# Patient Record
Sex: Male | Born: 1950 | Race: White | Hispanic: No | Marital: Single | Smoking: Never smoker
Health system: Southern US, Community
[De-identification: ages and names within clinical notes are randomized; demographics above are authoritative.]

## PROBLEM LIST (undated history)

## (undated) DIAGNOSIS — G4733 Obstructive sleep apnea (adult) (pediatric): Secondary | ICD-10-CM

## (undated) DIAGNOSIS — G4730 Sleep apnea, unspecified: Secondary | ICD-10-CM

## (undated) DIAGNOSIS — M549 Dorsalgia, unspecified: Secondary | ICD-10-CM

## (undated) DIAGNOSIS — E669 Obesity, unspecified: Secondary | ICD-10-CM

## (undated) DIAGNOSIS — E119 Type 2 diabetes mellitus without complications: Secondary | ICD-10-CM

## (undated) DIAGNOSIS — K219 Gastro-esophageal reflux disease without esophagitis: Secondary | ICD-10-CM

## (undated) HISTORY — PX: COLONOSCOPY: SHX174

## (undated) HISTORY — DX: Obesity, unspecified: E66.9

## (undated) HISTORY — PX: POLYPECTOMY: SHX149

## (undated) HISTORY — DX: Dorsalgia, unspecified: M54.9

## (undated) HISTORY — DX: Sleep apnea, unspecified: G47.30

## (undated) HISTORY — PX: TOE SURGERY: SHX1073

## (undated) HISTORY — DX: Obstructive sleep apnea (adult) (pediatric): G47.33

## (undated) HISTORY — DX: Type 2 diabetes mellitus without complications: E11.9

## (undated) HISTORY — PX: HERNIA REPAIR: SHX51

## (undated) HISTORY — DX: Gastro-esophageal reflux disease without esophagitis: K21.9

---

## 1988-10-31 HISTORY — PX: HEMANGIOMA EXCISION: SHX1734

## 2004-05-21 ENCOUNTER — Encounter: Admission: RE | Admit: 2004-05-21 | Discharge: 2004-08-19 | Payer: Self-pay | Admitting: Family Medicine

## 2007-02-19 ENCOUNTER — Ambulatory Visit: Payer: Self-pay | Admitting: Internal Medicine

## 2007-03-14 ENCOUNTER — Ambulatory Visit (HOSPITAL_BASED_OUTPATIENT_CLINIC_OR_DEPARTMENT_OTHER): Admission: RE | Admit: 2007-03-14 | Discharge: 2007-03-14 | Payer: Self-pay | Admitting: Internal Medicine

## 2007-03-18 ENCOUNTER — Ambulatory Visit: Payer: Self-pay | Admitting: Internal Medicine

## 2007-04-03 ENCOUNTER — Ambulatory Visit: Payer: Self-pay | Admitting: Internal Medicine

## 2007-04-23 ENCOUNTER — Ambulatory Visit: Payer: Self-pay | Admitting: Internal Medicine

## 2007-04-25 ENCOUNTER — Encounter: Admission: RE | Admit: 2007-04-25 | Discharge: 2007-04-25 | Payer: Self-pay | Admitting: Internal Medicine

## 2007-05-02 ENCOUNTER — Ambulatory Visit: Payer: Self-pay | Admitting: Internal Medicine

## 2007-05-02 LAB — CONVERTED CEMR LAB
ALT: 28 U/L
AST: 35 U/L
Albumin: 4.1 g/dL
Alkaline Phosphatase: 61 U/L
BUN: 12 mg/dL
Basophils Absolute: 0 K/uL
Basophils Relative: 0 %
Bilirubin Urine: NEGATIVE
Bilirubin, Direct: 0.2 mg/dL
CO2: 28 meq/L
Calcium: 9 mg/dL
Chloride: 107 meq/L
Cholesterol: 181 mg/dL
Creatinine, Ser: 0.9 mg/dL
Eosinophils Absolute: 0.1 K/uL
Eosinophils Relative: 1.7 %
GFR calc Af Amer: 113 mL/min
GFR calc non Af Amer: 93 mL/min
Glucose, Bld: 109 mg/dL — ABNORMAL HIGH
HCT: 42.4 %
HDL: 31.5 mg/dL — ABNORMAL LOW
Hemoglobin, Urine: NEGATIVE
Hemoglobin: 14.6 g/dL
Ketones, ur: NEGATIVE mg/dL
LDL Cholesterol: 119 mg/dL — ABNORMAL HIGH
Leukocytes, UA: NEGATIVE
Lymphocytes Relative: 36 %
MCHC: 34.4 g/dL
MCV: 85.5 fL
Monocytes Absolute: 0.7 K/uL
Monocytes Relative: 9.5 %
Neutro Abs: 3.7 K/uL
Neutrophils Relative %: 52.8 %
Nitrite: NEGATIVE
PSA: 0.46 ng/mL
Platelets: 292 K/uL
Potassium: 3.9 meq/L
RBC: 4.95 M/uL
RDW: 12.8 %
Sodium: 139 meq/L
Specific Gravity, Urine: <=1.005
TSH: 1.3 u[IU]/mL
Total Bilirubin: 1.3 mg/dL — ABNORMAL HIGH
Total CHOL/HDL Ratio: 5.7
Total Protein, Urine: NEGATIVE mg/dL
Total Protein: 7.4 g/dL
Triglycerides: 151 mg/dL — ABNORMAL HIGH
Uric Acid, Serum: 7.4 mg/dL — ABNORMAL HIGH
Urine Glucose: NEGATIVE mg/dL
Urobilinogen, UA: 0.2
VLDL: 30 mg/dL
WBC: 7 10*3/microliter
pH: 6

## 2007-05-03 ENCOUNTER — Ambulatory Visit: Payer: Self-pay | Admitting: Internal Medicine

## 2007-06-11 ENCOUNTER — Ambulatory Visit: Payer: Self-pay | Admitting: Internal Medicine

## 2007-07-23 ENCOUNTER — Ambulatory Visit: Payer: Self-pay | Admitting: Gastroenterology

## 2007-08-09 DIAGNOSIS — G4733 Obstructive sleep apnea (adult) (pediatric): Secondary | ICD-10-CM | POA: Insufficient documentation

## 2007-08-09 DIAGNOSIS — E669 Obesity, unspecified: Secondary | ICD-10-CM | POA: Insufficient documentation

## 2007-08-09 DIAGNOSIS — K219 Gastro-esophageal reflux disease without esophagitis: Secondary | ICD-10-CM | POA: Insufficient documentation

## 2007-08-31 ENCOUNTER — Encounter: Payer: Self-pay | Admitting: Gastroenterology

## 2007-08-31 ENCOUNTER — Ambulatory Visit: Payer: Self-pay | Admitting: Gastroenterology

## 2007-08-31 ENCOUNTER — Encounter: Payer: Self-pay | Admitting: Internal Medicine

## 2007-10-09 ENCOUNTER — Encounter: Payer: Self-pay | Admitting: Internal Medicine

## 2007-11-05 ENCOUNTER — Ambulatory Visit: Payer: Self-pay | Admitting: Internal Medicine

## 2007-11-05 DIAGNOSIS — N5089 Other specified disorders of the male genital organs: Secondary | ICD-10-CM | POA: Insufficient documentation

## 2007-11-05 DIAGNOSIS — M25569 Pain in unspecified knee: Secondary | ICD-10-CM | POA: Insufficient documentation

## 2007-11-05 DIAGNOSIS — M546 Pain in thoracic spine: Secondary | ICD-10-CM | POA: Insufficient documentation

## 2007-11-12 ENCOUNTER — Ambulatory Visit: Payer: Self-pay | Admitting: Internal Medicine

## 2007-11-15 ENCOUNTER — Encounter: Payer: Self-pay | Admitting: Internal Medicine

## 2007-11-20 ENCOUNTER — Encounter: Payer: Self-pay | Admitting: Internal Medicine

## 2007-11-22 ENCOUNTER — Telehealth: Payer: Self-pay | Admitting: Internal Medicine

## 2007-11-26 ENCOUNTER — Telehealth (INDEPENDENT_AMBULATORY_CARE_PROVIDER_SITE_OTHER): Payer: Self-pay | Admitting: *Deleted

## 2007-11-27 ENCOUNTER — Encounter: Payer: Self-pay | Admitting: Internal Medicine

## 2008-08-26 ENCOUNTER — Ambulatory Visit: Payer: Self-pay | Admitting: Internal Medicine

## 2008-08-26 DIAGNOSIS — B070 Plantar wart: Secondary | ICD-10-CM | POA: Insufficient documentation

## 2008-08-26 DIAGNOSIS — L259 Unspecified contact dermatitis, unspecified cause: Secondary | ICD-10-CM | POA: Insufficient documentation

## 2008-08-26 DIAGNOSIS — IMO0002 Reserved for concepts with insufficient information to code with codable children: Secondary | ICD-10-CM | POA: Insufficient documentation

## 2008-11-21 ENCOUNTER — Ambulatory Visit: Payer: Self-pay | Admitting: Internal Medicine

## 2008-11-24 ENCOUNTER — Ambulatory Visit: Payer: Self-pay | Admitting: Internal Medicine

## 2008-11-28 ENCOUNTER — Telehealth: Payer: Self-pay | Admitting: Internal Medicine

## 2008-12-15 ENCOUNTER — Ambulatory Visit: Payer: Self-pay | Admitting: Internal Medicine

## 2009-05-08 ENCOUNTER — Ambulatory Visit: Payer: Self-pay | Admitting: Internal Medicine

## 2009-05-08 DIAGNOSIS — R002 Palpitations: Secondary | ICD-10-CM | POA: Insufficient documentation

## 2009-05-09 ENCOUNTER — Telehealth: Payer: Self-pay | Admitting: Internal Medicine

## 2009-08-28 ENCOUNTER — Ambulatory Visit: Payer: Self-pay | Admitting: Family

## 2009-08-28 DIAGNOSIS — R42 Dizziness and giddiness: Secondary | ICD-10-CM | POA: Insufficient documentation

## 2009-08-31 ENCOUNTER — Ambulatory Visit: Payer: Self-pay | Admitting: Internal Medicine

## 2009-08-31 DIAGNOSIS — K0500 Acute gingivitis, plaque induced: Secondary | ICD-10-CM | POA: Insufficient documentation

## 2010-06-15 ENCOUNTER — Ambulatory Visit: Payer: Self-pay | Admitting: Radiology

## 2010-06-15 ENCOUNTER — Ambulatory Visit (HOSPITAL_BASED_OUTPATIENT_CLINIC_OR_DEPARTMENT_OTHER): Admission: RE | Admit: 2010-06-15 | Discharge: 2010-06-15 | Payer: Self-pay | Admitting: Internal Medicine

## 2010-06-15 ENCOUNTER — Telehealth: Payer: Self-pay | Admitting: Internal Medicine

## 2010-06-15 ENCOUNTER — Ambulatory Visit: Payer: Self-pay | Admitting: Internal Medicine

## 2010-06-15 DIAGNOSIS — M25519 Pain in unspecified shoulder: Secondary | ICD-10-CM | POA: Insufficient documentation

## 2010-07-15 ENCOUNTER — Encounter: Payer: Self-pay | Admitting: Internal Medicine

## 2010-11-28 LAB — CONVERTED CEMR LAB
Calcium: 9.3 mg/dL (ref 8.4–10.5)
Creatinine, Ser: 1.05 mg/dL (ref 0.40–1.50)
Free T4: 0.97 ng/dL (ref 0.80–1.80)
Potassium: 4 meq/L (ref 3.5–5.3)
Sodium: 141 meq/L (ref 135–145)

## 2010-12-01 NOTE — Assessment & Plan Note (Signed)
Summary: shoulder pain & needs finger looked at/dt also has plantar wart   Vital Signs:  Patient profile:   60 year old male Weight:      289 pounds BMI:     41.03 O2 Sat:      95 % on Room air Temp:     97.9 degrees F oral Pulse rate:   67 / minute Pulse rhythm:   regular Resp:     20 per minute BP sitting:   124 / 90  (left arm) Cuff size:   large  Vitals Entered By: Glendell Docker CMA (June 15, 2010 9:53 AM)  O2 Flow:  Room air CC: Left shoulder pain Is Patient Diabetic? No Pain Assessment Patient in pain? yes     Location: shoulder Intensity: 2 Type: aching Onset of pain  With activity Comments left shoulder pain work related injury, onset about 2 months ago, intermittent pain with activity, pt has questions about what a normal pulse should be for him   Primary Care Provider:  D. Thomos Lemons DO  CC:  Left shoulder pain.  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  goes back 4-5 yrs lifted heavy branch - oct 2010 (tweeked shoulder) got better soon thereafter last 1-2 months - progressively worse pain with int rotation - can't itch his back aches at night,  worse laying on left side usually dull ache, sometimes feels like shoulder comes out of joint    Preventive Screening-Counseling & Management  Alcohol-Tobacco     Smoking Status: current  Allergies: 1)  ! * Sodium Pentathol  Past History:  Past Medical History: Obesity Obstructive Sleep Apnea Chronic GERD with remote history of duodenal ulcer  Chronic back pain         Past Surgical History: Excision of left leg cavernous hemangioma - 1990        Family History: Mother is age 25 with history of CAD, hypertension and severe DJD of the spine Mother  with history of bone cancer  Brother with history of congenital heart murmur Mother - NPH   Social History: Occupation:  Works in Therapist, sports Single Never Smoked  Alcohol use-yes (two to 3 alcoholic beverages per week) Hobby - scuba diving     Smoking Status:  current  Review of Systems  The patient denies chest pain and dyspnea on exertion.         occ rash on right middle finger,    Physical Exam  General:  alert and overweight-appearing.   Head:  normocephalic and atraumatic.   Neck:  thick neckno carotid bruits.   Lungs:  Normal respiratory effort, chest expands symmetrically. Lungs are clear to auscultation, no crackles or wheezes. Heart:  Normal rate and regular rhythm. S1 and S2 normal without gallop, murmur, click, rub or other extra sounds. Msk:  pain with left shoulder int rotation Skin:  mild scaly patch right middle finger wart on ball of right foot   Impression & Recommendations:  Problem # 1:  SHOULDER PAIN, LEFT, CHRONIC (ICD-719.41) chronic left shoulder pain.  hx of prev work related injury.   question labrum tear.  refer to ortho for further eval.  defer MRI of shoulder to ortho His updated  medication list for this problem includes:    Low-dose Aspirin 81 Mg Tabs (Aspirin) .Marland Kitchen... Take 1 tablet by mouth once a day  Orders: T-Shoulder Left Min 2 Views (73030TC) Orthopedic Referral (Ortho)  Problem # 2:  PLANTAR WART, RIGHT (ICD-078.12) more symptomatic.  refer to podiatry for possible surgical excision Orders: Podiatry Referral (Podiatry)  Problem # 3:  ECZEMA (ICD-692.9) pt with scaly rash on right middle finger.  The following medications were removed from the medication list:    Triamcinolone Acetonide 0.5 % Crea (Triamcinolone acetonide) .Marland Kitchen... Apply bid  Complete Medication List: 1)  Low-dose Aspirin 81 Mg Tabs (Aspirin) .... Take 1 tablet by mouth once a day 2)  Clotrimazole-betamethasone 1-0.05 % Crea (Clotrimazole-betamethasone) ....  Apply two times a day x 2 weeks as directed  Patient Instructions: 1)  Please schedule a follow-up appointment in 6 months Prescriptions: CLOTRIMAZOLE-BETAMETHASONE 1-0.05 % CREA (CLOTRIMAZOLE-BETAMETHASONE) apply two times a day x 2 weeks as directed  #30 grams x 1   Entered and Authorized by:   D. Thomos Lemons DO   Signed by:   D. Thomos Lemons DO on 06/15/2010   Method used:   Electronically to        [id number] (retail)       [address]       [place], [place]  [address]       Ph: [phone] or [phone]       Fax: [phone]   RxID:   [id number]   Current Allergies (reviewed today): ! * SODIUM PENTATHOL

## 2010-12-01 NOTE — Consult Note (Signed)
Summary: [facility]  [facility]   Imported By: Lanelle Bal 07/28/2010 14:20:10  _____________________________________________________________________  External Attachment:    Type:   Image     Comment:   External Document

## 2010-12-01 NOTE — Progress Notes (Signed)
Summary: Xray Results  Phone Note Outgoing Call   Summary of Call: call pt - xray of shoulder is normal Initial call taken by: D. Thomos Lemons DO,  June 15, 2010 2:06 PM  Follow-up for Phone Call        call placed to patient at [phone], he was advised per Dr Artist Pais instructions Follow-up by: Glendell Docker CMA,  June 16, 2010 9:24 AM

## 2011-03-15 NOTE — Assessment & Plan Note (Signed)
[facility]                           PRIMARY CARE OFFICE NOTE   CHERYL, STABENOW                        MRN:          [id number]  DATE:04/23/2007                            DOB:          05-14-1951    CHIEF COMPLAINT:  New patient to practice.   HISTORY OF PRESENT ILLNESS:  The patient is a 60 year old white male  here for continuity of care.  He has seen Dr. Maple Hudson in the past for  symptoms of daytime somnolence and was ultimately diagnosed with  obstructive sleep apnea.  Other than sleep apnea and periodic elevated  blood pressure readings in the past, he does not note any major  illnesses including heart disease or type 2 diabetes.  He does have  chronic GERD and was diagnosed with a duodenal ulcer 25 years ago.  He  consumes a large amount of caffeinated beverages/diet coke,  approximately two 6-packs per day.  Since decreasing his caffeinated  beverage use, he has noticed some improvement in his heartburn symptoms.  He does have occasional dysphagia with steak/bread getting stuck near  his epigastric area.   He has struggled with obesity for most of his life.  He is currently at  his weight of 285 pounds.  He has been in between 250-280 within the  past 5 years.  His main concern today has been bilateral swelling of his  feet; this has been ongoing since June 8.  He was [place] and was in a long plane ride.  He  complains of bilateral leg swelling and also severe pain intermittently  in his great toe of his right and left foot.  There was some associated  redness.   PAST MEDICAL HISTORY SUMMARY:  1. Obstructive sleep apnea.  2. Elevated blood pressure readings without diagnosis of hypertension.  3. Chronic GERD with remote history of duodenal ulcer.  4. History of surgery for left leg cavernous hemangioma in 1990.   CURRENT MEDICATIONS:  None.   ALLERGIES TO MEDICATIONS:  None known.   SOCIAL HISTORY:  The patient is single, currently works in Engineer, structural estate.   FAMILY HISTORY:  Mother is age 89 with a history of coronary disease,  hypertension and severe DJD of the spine.  Father is 42, relatively  healthy, and has a younger brother who was born with a heart murmur.  Mother is known to have bone cancer before which was removed.   HABITS:  Two to three alcoholic beverages per week.  No tobacco.   REVIEW OF SYSTEMS:  No fevers or chills.  Denies any HEENT symptoms.  Has some dyspnea on exertion, but no chest pain.  Heartburn symptoms as  noted above.  No dysuria, frequency or urgency and all other systems  negative.  The patient has had chronic back pain in his thoracolumbar  junction.   PHYSICAL EXAMINATION:  VITALS:  Weight is 285.2 pounds, temperature is  98.9, pulse is 65, BP is 142/86 in the left arm in a seated position.  GENERAL:  The patient is  a pleasant, morbidly obese 60 year old white  male, pleasant, in no apparent distress.  HEENT:  Normocephalic, atraumatic.  Pupils were equal, round and  reactive to light bilaterally.  Extraocular muscle mobility were intact.  The patient was anicteric.  Conjunctivae were within normal limits.  External auditory canals and tympanic membranes were clear bilaterally.  Hearing was grossly normal.  Oropharyngeal exam was unremarkable.  NECK:  Thick.  I could not appreciate carotid bruit.  CHEST:  Normal respiratory effort.  Chest was clear to auscultation  bilaterally.  No rhonchi, rales or wheezing.  CARDIOVASCULAR:  Regular rate and rhythm.  No significant murmurs, rubs  or gallops appreciated.  ABDOMEN:  Protuberant and nontender.  Positive bowel sounds.  No  organomegaly.   IMPRESSION/RECOMMENDATIONS:  1. Lower extremity edema, rule out deep venous thrombosis.  2. Possible gout.  3. Chronic lower back pain.  4. Obstructive sleep apnea.  5. Gastroesophageal reflux disease exacerbated by caffeine  use.  6. Health maintenance.   RECOMMENDATIONS:  1. The patient will be sent for venous Doppler to rule out DVT.  An x-      ray of the lumbar and thoracic spine will be performed.  2.  We      will screen him for type 2 diabetes and check his fasting lipid      profile.  2. The patient is to decrease caffeine use.  He deferred starting      omeprazole 20 mg once a day.  We discussed eventual referral to      Gastroenterology for EGD to rule out esophageal stricture.  3. Follow up in about 2 weeks.     Barbette Hair. Artist Pais, DO  Electronically Signed    RDY/MedQ  DD: 04/23/2007  DT: 04/24/2007  Job #: [id number]

## 2011-03-15 NOTE — Assessment & Plan Note (Signed)
[name]                             PULMONARY OFFICE NOTE   NAME:[name], [name]                        MRN:          [id number]  DATE:04/03/2007                            DOB:          [birth year]    PROBLEM LIST:  1. Obstructive sleep apnea.  2. Exogenous obesity.   HISTORY:  His sleep study of Mar 14, 2007 demonstrated moderately severe  obstructive apnea, with an index of 41 per hour, nonpositional events,  moderate to loud snoring, and oxygen desaturation to 81%.  CPAP was  titrated to 15 CWP, for an index of 0 per hour.  He had been working  with [employer], using their CPAP representative.  I gave him the  opportunity of returning to that home care support for pressure change  or establishing with a different company.  He is going to start by  trying to work with his existing team.  He is trying to lose some weight  by dieting.  We had previously reviewed sleep hygiene and alternative  therapies for sleep apnea.   MEDICATIONS:  Limited to CPAP, currently at 9 CWP, which had been  insufficient.   OBJECTIVE:  VITAL SIGNS:  Weight 286 pounds, BP 122/72, pulse 105, room  air saturation 97%.  GENERAL:  He is obese, alert.  HEENT:  Nasal airway is unobstructed.  There are no pressure marks on  his face from CPAP.  HEART:  Sounds are regular, without murmur.  LUNGS:  Clear.   IMPRESSION:  Moderately severe obstructive sleep apnea.   PLAN:  1. Weight loss.  2. Try CPAP at 15 CWP.  3. He is referred to a nutritionalist at [facility] at his request      to support his weight loss effort.  4. Schedule return in 2 months, earlier p.r.n.     Clinton D. Maple Hudson, MD, Tonny Bollman, FACP  Electronically Signed    CDY/MedQ  DD: 04/04/2007  DT: 04/05/2007  Job #: [id number]

## 2011-03-15 NOTE — Assessment & Plan Note (Signed)
[facility]                             PULMONARY OFFICE NOTE   NAME:JOHNSONClancy, Mullarkey                        MRN:          [id number]  DATE:06/11/2007                            DOB:          1951/08/29    PROBLEM:  1. Obstructive sleep apnea.  2. Exogenous obesity.   PRIMARY CARE PHYSICIAN:  Barbette Hair. 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 He is  experiencing some nasal congestion with this, but not using his  humidifier and he is aware of some mouth venting. We have discussed use  and comfort measures. He is not on other medications.   OBJECTIVE:  Weight 286 pounds, blood pressure 128/78, pulse 68. Room air  saturation is 97%.  He is somewhat overweight. Quite alert.  Nose and throat are clear.  Breathing is unlabored.  Pulse regular.   IMPRESSION:  Obstructive sleep apnea. He is probably experiencing some  vasomotor rhinitis from airflow related to the CPAP and we have  discussed comfort measures.   PLAN:  1. Try reducing CPAP to 14 CWP.  2. He is going to try a chin strap.  3. Continue efforts to lose weight.  4. He is going to start using the humidifier attachment on his      machine.  5. Schedule return four months, earlier p.r.n.     Clinton D. Maple Hudson, MD, Tonny Bollman, FACP  Electronically Signed    CDY/MedQ  DD: 06/11/2007  DT: 06/12/2007  Job #: [id number]   cc:   Barbette Hair. Artist Pais, DO

## 2011-03-15 NOTE — Procedures (Signed)
NAME:  [name], [name]               ACCOUNT NO.:  [id number]   MEDICAL RECORD NO.:  [id number]          PATIENT TYPE:  OUT   LOCATION:  SLEEP CENTER                 FACILITY:  [facility]   PHYSICIAN:  Clinton D. Maple Hudson, MD, FCCP, FACPDATE OF BIRTH:  04/[birth year]/52   DATE OF STUDY:  03/14/2007                            NOCTURNAL POLYSOMNOGRAM   REFERRING PHYSICIAN:   REFERRING PHYSICIAN:  Clinton D. Young, MD, FCCP, FACP   INDICATION FOR STUDY:  Hypersomnia with sleep apnea.  Epworth sleepiness  score 11 to 14/24.  BMI 39.8.  Weight 282 pounds.   SLEEP ARCHITECTURE:  Total sleep time 307 minutes with sleep efficiency  78%.  Stage I was 7%, stage II 78%, stages III and IV were 1%, REM 14%  of total sleep time.  Sleep latency 53 minutes.  REM latency 130  minutes.  Awake after sleep onset 34 minutes, arousal index 11.7.  No  bedtime medication was taken.   RESPIRATORY DATA:  Split-study protocol.  Apnea/hypotonia index (AHI,  RDI) 41obstructive events per hour indicating moderately severe  obstructive sleep apnea/hypotonia syndrome before CPAP.  There were 37  obstructive apneas, one central apnea, two mixed apneas, and 55  hypotonias before CPAP.  Events were not positional.  REMAHI 27.6.  CPAP  was titrated to 15 CWP, AHI 0 per hour.  A medium ResMed Swift 2 nasal  mask was used with heated humidifier.   OXYGEN DATA:  Moderate to loud snoring with oxygen desaturation to a  nadir of 81% before CPAP.  After CPAP control, saturation held 94% on  room air.  Snoring was prevented and oxygen saturation held at 95% on  CPAP.   CARDIAC DATA:  Normal sinus rhythm.   MOVEMENT/PARASOMNIA:  Occasional limb jerk, insignificant.   IMPRESSION/RECOMMENDATIONS:  1. Moderately severe obstructive sleep apnea/hypotonia syndrome, AHI      41 per hour with nonpositional events, moderate to loud snoring and      oxygen desaturation to a nadir of 81%.  2. Successful CPAP titration to 15 CWP, AHI 0 per hour.   May need a      ResMed Swift 2 nasal mask was used with heated humidifier.      Clinton D. Maple Hudson, MD, [facility], FACP  Diplomate, Biomedical engineer of Sleep Medicine  Electronically Signed     CDY/MEDQ  D:  03/18/2007 11:40:23  T:  03/19/2007 02:14:21  Job:  [id number]

## 2011-03-18 NOTE — Assessment & Plan Note (Signed)
[facility]                             PULMONARY OFFICE NOTE   NAME:JOHNSONCurlie, Macken                        MRN:          [id number]  DATE:02/19/2007                            DOB:          [birth year]    PROBLEM:  This is a 60 year old man with obstructive sleep apnea, coming  to establish for care upon his own referral.  He had been followed by  [facility], but is no longer followed by them and asks to be  established with a new primary physician.  He had had a sleep study  through [facility] with an index of 22 obstructive events per hour.  He has been using CPAP at 9-10 CWP with a nasal pillow's mask.  Initially, that worked well.  He has gained weight.  He says now that he  remains tired.  He also wakes with his hands and feet asleep when he has  been using CPAP, which may be related to some restriction of movement.  He finds he is drawing air in through the mask vents.  He took his  machine and computer chip to [facility] and the technician told him  that the machine was working fine.  He was frustrated by that and  wants reevaluation.  He stopped using his CPAP about a month ago.  Usual  bedtime is between 11 and midnight, estimating 5-minute sleep latency  and waking 0-2 times per night before finally up between 6:00 and 7:30  a.m.  He does not notice significant problems breathing through his  nose.   REVIEW OF SYSTEMS:  Weight gain, some shortness of breath at this  weight.  Daytime sleepiness if inactive and sometimes while driving.  Nonrestorative sleep, dosing off in the evening watching TV.  Mild  indigestion.   PAST HISTORY:  1. Tonsillectomy.  2. Hypertension.  3. Sleep apnea.  4. No history of heart or lung disease, neurologic problems or thyroid      disease.  5. Surgery for a cavernous hemangioma resected from left thigh in      1991.   MEDICATIONS:  CPAP at 9 CWP, currently not being used.  No prescription  medications.   ALLERGIES:  Drug intolerant to PENICILLIN.   SOCIAL HISTORY:  Never smoked.  Not married.  Three beers per week.  No  children.  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   FAMILY HISTORY:  Nobody with sleep-disturbed breathing.  Mother and  brother had heart disease and mother had cancer.   OBJECTIVE:  Weight 288 pounds.  Blood pressure 140/80.  Pulse regular at  95.  Room air saturation 95%.  This is an overweight man, alert, in no distress.  HEENT:  Palate spacing 3 to 4/4.  Nasal airway is unobstructed.  Voice  quality is normal.  There is no stridor, thyromegaly or neck vein distention.  CHEST:  Quiet, clear lung fields.  Heart sounds are regular without  murmur or gallop.  EXTREMITIES:  No restlessness, tremor or edema.  IMPRESSION:  1. Obstructive sleep apnea, needing to reestablish appropriate      pressure settings.  2. Obesity.  Weight gain may be destabilizing his sleep apnea control.   PLAN:  1. Front disk will help him establish with a primary physician of his      choice.  2. Split protocol sleep study at the [facility] to reestablish      diagnosis and best CPAP settings.  Schedule return after that is      complete.  Meanwhile, he is to work on his weight and I am      emphasized his responsibility to drive safely.     Clinton D. Maple Hudson, MD, Ton[facility] Bollman, FACP  Electronically Signed    CDY/MedQ  DD: 02/19/2007  DT: 02/20/2007  Job #: [id number]   cc:   [facility]

## 2011-08-03 ENCOUNTER — Encounter: Payer: Self-pay | Admitting: Internal Medicine

## 2011-08-03 ENCOUNTER — Ambulatory Visit (INDEPENDENT_AMBULATORY_CARE_PROVIDER_SITE_OTHER): Payer: BC Managed Care – PPO | Admitting: Internal Medicine

## 2011-08-03 VITALS — BP 132/82 | HR 80 | Temp 98.1°F | Ht 70.5 in | Wt 294.0 lb

## 2011-08-03 DIAGNOSIS — G4733 Obstructive sleep apnea (adult) (pediatric): Secondary | ICD-10-CM

## 2011-08-03 DIAGNOSIS — K0500 Acute gingivitis, plaque induced: Secondary | ICD-10-CM

## 2011-08-03 MED ORDER — AMOXICILLIN-POT CLAVULANATE 875-125 MG PO TABS: 1.0000 | ORAL_TABLET | Freq: Two times a day (BID) | ORAL | Status: AC

## 2011-08-03 NOTE — Assessment & Plan Note (Signed)
Right lower gingivitis.  Treat with augmentin.   Pt advised to follow up with dentist

## 2011-08-03 NOTE — Assessment & Plan Note (Signed)
Arrange home CPAP titration study

## 2011-08-03 NOTE — Patient Instructions (Signed)
Please call our office if your symptoms do not improve or gets worse.  

## 2011-08-03 NOTE — Progress Notes (Signed)
  Subjective:    Patient ID: [name], male    DOB: 07-03-1951, 60 y.o.   MRN: [id number]  HPI  60 y/o white male complains of right lower gum pain x 1 week.  Area is throbbing.   He has hx of gingivitis but has not had the opportunity to see his dentist.  He denies fever or chills.  He also has hx of plantar warts on right foot.  He self treated this summer with "home surgical instruments".  No sign of infection. He still has small "knot" but pain is better.  He also has hx of OSA.  He was diagnosed 4 yrs ago but never arranged CPAP.  Review of Systems See HPI  Past Medical History  Diagnosis Date  . Obesity   . OSA (obstructive sleep apnea)   . GERD (gastroesophageal reflux disease)   . Back pain     chronic    History   Social History  . Marital Status: Single    Spouse Name: N/A    Number of Children: N/A  . Years of Education: N/A   Occupational History  . Not on file.   Social History Main Topics  . Smoking status: Never Smoker   . Smokeless tobacco: Not on file  . Alcohol Use: Yes  . Drug Use: No  . Sexually Active:    Other Topics Concern  . Not on file   Social History Narrative  . No narrative on file    Past Surgical History  Procedure Date  . Hemangioma excision 1990    left leg    Family History  Problem Relation Age of Onset  . Arthritis Mother     spine  . Heart disease Mother   . Hypertension Mother   . Cancer Mother     bone  . Heart disease Brother     murmur    No Known Allergies  No current outpatient prescriptions on file prior to visit.    BP 132/82  Pulse 80  Temp(Src) 98.1 F (36.7 C) (Oral)  Ht 5' 10.5" (1.791 m)  Wt 294 lb (133.358 kg)  BMI 41.59 kg/m2       Objective:   Physical Exam   Constitutional: pleasant, overweight appearing Head: Normocephalic and atraumatic.  Ear:  normal Mouth/Throat: Oropharynx is clear and moist.  right lower gum line appears red and swollen, no pus Eyes: Conjunctivae  are normal. Pupils are equal, round, and reactive to light.  Neck: Normal range of motion. Neck supple. No thyromegaly present. No adenopathy Cardiovascular: Normal rate, regular rhythm and normal heart sounds.  Exam reveals no gallop and no friction rub.  No murmur heard. Pulmonary/Chest: Effort normal and breath sounds normal.  No wheezes. No rales.  Neurological: Alert. No cranial nerve deficit.  Skin: Skin is warm and dry.  Psychiatric: Normal mood and affect. Behavior is normal.     Assessment & Plan:

## 2011-10-12 ENCOUNTER — Other Ambulatory Visit: Payer: Self-pay | Admitting: Internal Medicine

## 2011-10-12 MED ORDER — AMOXICILLIN-POT CLAVULANATE 875-125 MG PO TABS: 1.0000 | ORAL_TABLET | Freq: Two times a day (BID) | ORAL | Status: AC

## 2011-10-12 NOTE — Telephone Encounter (Signed)
rx sent in electronically 

## 2011-10-12 NOTE — Telephone Encounter (Signed)
Pt called and has another infection under tooth and in gums. Pt is req a refill of amoxicillin-clavulanate (AUGMENTIN) 875-125 MG per tablet called in to [facility] on [address] of [address]. Pt would like to be notified when this has been called in.

## 2011-10-12 NOTE — Telephone Encounter (Signed)
Ok to refill x 1  

## 2012-06-20 ENCOUNTER — Encounter: Payer: Self-pay | Admitting: Gastroenterology

## 2012-10-13 ENCOUNTER — Encounter (HOSPITAL_COMMUNITY): Payer: Self-pay | Admitting: *Deleted

## 2012-10-13 ENCOUNTER — Emergency Department (HOSPITAL_COMMUNITY)
Admission: EM | Admit: 2012-10-13 | Discharge: 2012-10-13 | Disposition: A | Discharge status: Home / Self Care | Payer: BC Managed Care – PPO | Attending: Emergency Medicine | Admitting: Emergency Medicine

## 2012-10-13 DIAGNOSIS — E669 Obesity, unspecified: Secondary | ICD-10-CM | POA: Insufficient documentation

## 2012-10-13 DIAGNOSIS — M5380 Other specified dorsopathies, site unspecified: Secondary | ICD-10-CM | POA: Insufficient documentation

## 2012-10-13 DIAGNOSIS — X58XXXA Exposure to other specified factors, initial encounter: Secondary | ICD-10-CM | POA: Insufficient documentation

## 2012-10-13 DIAGNOSIS — B369 Superficial mycosis, unspecified: Secondary | ICD-10-CM

## 2012-10-13 DIAGNOSIS — M62830 Muscle spasm of back: Secondary | ICD-10-CM

## 2012-10-13 DIAGNOSIS — M549 Dorsalgia, unspecified: Secondary | ICD-10-CM | POA: Insufficient documentation

## 2012-10-13 DIAGNOSIS — S239XXA Sprain of unspecified parts of thorax, initial encounter: Secondary | ICD-10-CM | POA: Insufficient documentation

## 2012-10-13 DIAGNOSIS — M79609 Pain in unspecified limb: Secondary | ICD-10-CM | POA: Insufficient documentation

## 2012-10-13 DIAGNOSIS — Z79899 Other long term (current) drug therapy: Secondary | ICD-10-CM | POA: Insufficient documentation

## 2012-10-13 DIAGNOSIS — Y939 Activity, unspecified: Secondary | ICD-10-CM | POA: Insufficient documentation

## 2012-10-13 DIAGNOSIS — Z87828 Personal history of other (healed) physical injury and trauma: Secondary | ICD-10-CM | POA: Insufficient documentation

## 2012-10-13 DIAGNOSIS — Y929 Unspecified place or not applicable: Secondary | ICD-10-CM | POA: Insufficient documentation

## 2012-10-13 DIAGNOSIS — Z8669 Personal history of other diseases of the nervous system and sense organs: Secondary | ICD-10-CM | POA: Insufficient documentation

## 2012-10-13 DIAGNOSIS — G8929 Other chronic pain: Secondary | ICD-10-CM | POA: Insufficient documentation

## 2012-10-13 DIAGNOSIS — S29019A Strain of muscle and tendon of unspecified wall of thorax, initial encounter: Secondary | ICD-10-CM

## 2012-10-13 DIAGNOSIS — B351 Tinea unguium: Secondary | ICD-10-CM

## 2012-10-13 DIAGNOSIS — Z8719 Personal history of other diseases of the digestive system: Secondary | ICD-10-CM | POA: Insufficient documentation

## 2012-10-13 LAB — COMPREHENSIVE METABOLIC PANEL
Albumin: 4.3 g/dL (ref 3.5–5.2)
Alkaline Phosphatase: 72 U/L (ref 39–117)
BUN: 23 mg/dL (ref 6–23)
Calcium: 9.4 mg/dL (ref 8.4–10.5)
Chloride: 99 mEq/L (ref 96–112)
Creatinine, Ser: 0.96 mg/dL (ref 0.50–1.35)
GFR calc non Af Amer: 88 mL/min — ABNORMAL LOW (ref >90)
Potassium: 4.3 mEq/L (ref 3.5–5.1)
Total Bilirubin: 0.7 mg/dL (ref 0.3–1.2)
Total Protein: 7.8 g/dL (ref 6.0–8.3)

## 2012-10-13 LAB — CBC WITH DIFFERENTIAL/PLATELET
Basophils Absolute: 0 10*3/uL (ref 0.0–0.1)
Eosinophils Absolute: 0.2 10*3/uL (ref 0.0–0.7)
HCT: 43.5 % (ref 39.0–52.0)
MCH: 29 pg (ref 26.0–34.0)
MCHC: 34.3 g/dL (ref 30.0–36.0)
Neutro Abs: 5.1 10*3/uL (ref 1.7–7.7)
Neutrophils Relative %: 57 % (ref 43–77)
Platelets: 257 10*3/uL (ref 150–400)
RBC: 5.13 MIL/uL (ref 4.22–5.81)
RDW: 13.3 % (ref 11.5–15.5)

## 2012-10-13 MED ORDER — NAPROXEN 500 MG PO TABS: 500.0000 mg | ORAL_TABLET | Freq: Two times a day (BID) | ORAL | Status: DC | PRN

## 2012-10-13 MED ORDER — METHOCARBAMOL 750 MG PO TABS: 750.0000 mg | ORAL_TABLET | Freq: Four times a day (QID) | ORAL | Status: DC | PRN

## 2012-10-13 MED ORDER — CEPHALEXIN 500 MG PO CAPS: 500.0000 mg | ORAL_CAPSULE | Freq: Four times a day (QID) | ORAL | Status: DC

## 2012-10-13 MED ORDER — TERBINAFINE HCL 250 MG PO TABS: 250.0000 mg | ORAL_TABLET | Freq: Every day | ORAL | Status: DC

## 2012-10-13 NOTE — ED Notes (Signed)
Pt c/o right ring finger pain. Reports injury to fingernail and surgical removal in November. Reports fingernail seems infected even after treatment with antibiotics. Pt also c/o mid back pain. Denies known injury.

## 2012-10-13 NOTE — ED Provider Notes (Signed)
History     CSN: [id number]  Arrival date & time 10/13/12  1623   First MD Initiated Contact with Patient 10/13/12 1703      Chief Complaint  Patient presents with  . Finger Injury  . Back Pain    (Consider location/radiation/quality/duration/timing/severity/associated sxs/prior treatment) The history is provided by the patient and medical records.    [name] is a 61 y.o. male  with a hx of chronic back pain presents to the Emergency Department complaining of gradual, persistent, progressively worsening mid back pain onset 6 weeks ago.  Pain is worse on the Right, but sometimes spreads across the L side as well, described as a cramping pain, rated at a 1/10 at this time, described as stabbing and increases to a 6/10 at time. Associated symptoms include stiffness in the back when remaining in the same position for a long period of time.  Nothing makes it better and reclining on his elbows makes it worse.  Pt denies fever, chills, headache, neck pain chest pain, shortness of breath, abdominal pain, nausea, vomiting, diarrhea, weakness, dizziness, syncope..  Pt with Hx of back injury in college.  He has had chronic pain intermittently since.  He is also remodeling a house and has spend much time doing physical labor in the last few months.  He denies injury this time.  He denies loss of bowel or bladder control, loss of function of legs, numbness or tingling, saddle anesthesia.  Pt also c/o finger infection to the R ring finger.  Nov 23 he sustained an injury to the finger when he bent the nail back after jamming it onto a 2x4.  Patient with a history of onychomycosis which has never been treated.  November 29 he had the nail removed and completed a course of Keflex.  He states he's kept it bandaged at the Band-Aid which is not allow to remain wet and has noticed throughout this time that the area surrounding the nail has remained red.  He states this morning there was a white spot however  the area was not swollen, did not ooze and the spot has resolved.  Past Medical History  Diagnosis Date  . Obesity   . OSA (obstructive sleep apnea)   . GERD (gastroesophageal reflux disease)   . Back pain     chronic    Past Surgical History  Procedure Date  . Hemangioma excision 1990    left leg    Family History  Problem Relation Age of Onset  . Arthritis Mother     spine  . Heart disease Mother   . Hypertension Mother   . Cancer Mother     bone  . Heart disease Brother     murmur    History  Substance Use Topics  . Smoking status: Never Smoker   . Smokeless tobacco: Not on file  . Alcohol Use: Yes      Review of Systems  Constitutional: Negative for fever, diaphoresis, appetite change, fatigue and unexpected weight change.  HENT: Negative for mouth sores and neck stiffness.   Eyes: Negative for visual disturbance.  Respiratory: Negative for cough, chest tightness, shortness of breath and wheezing.   Cardiovascular: Negative for chest pain.  Gastrointestinal: Negative for nausea, vomiting, abdominal pain, diarrhea and constipation.  Genitourinary: Negative for dysuria, urgency, frequency and hematuria.  Musculoskeletal: Positive for back pain. Negative for gait problem.  Skin: Positive for color change and wound. Negative for rash.  Neurological: Negative for syncope, light-headedness and  headaches.  Psychiatric/Behavioral: Negative for sleep disturbance. The patient is not nervous/anxious.     Allergies  Other  Home Medications   Current Outpatient Rx  Name  Route  Sig  Dispense  Refill  . ADULT MULTIVITAMIN W/MINERALS CH   Oral   Take 1 tablet by mouth daily.         Marland Kitchen METHOCARBAMOL 750 MG PO TABS   Oral   Take 1 tablet (750 mg total) by mouth 4 (four) times daily as needed (Take 1 tablet every 6 hours as needed for muscle spasms.).   20 tablet   0   . NAPROXEN 500 MG PO TABS   Oral   Take 1 tablet (500 mg total) by mouth 2 (two) times  daily as needed.   60 tablet   0   . TERBINAFINE HCL 250 MG PO TABS   Oral   Take 1 tablet (250 mg total) by mouth daily.   42 tablet   0     BP 149/81  Pulse 79  Temp 98.1 F (36.7 C) (Oral)  Resp 18  SpO2 96%  Physical Exam  Nursing note and vitals reviewed. Constitutional: He appears well-developed and well-nourished. No distress.  HENT:  Head: Normocephalic and atraumatic.  Mouth/Throat: Oropharynx is clear and moist. No oropharyngeal exudate.  Eyes: Conjunctivae normal are normal. Pupils are equal, round, and reactive to light. No scleral icterus.  Neck: Normal range of motion and full passive range of motion without pain. Neck supple. No spinous process tenderness and no muscular tenderness present. No rigidity. Normal range of motion present.  Cardiovascular: Normal rate, regular rhythm, normal heart sounds and intact distal pulses.  Exam reveals no gallop and no friction rub.   No murmur heard. Pulses:      Radial pulses are 2+ on the right side, and 2+ on the left side.       Dorsalis pedis pulses are 2+ on the right side, and 2+ on the left side.       Posterior tibial pulses are 2+ on the right side, and 2+ on the left side.  Pulmonary/Chest: Effort normal and breath sounds normal. No respiratory distress. He has no decreased breath sounds. He has no wheezes. He has no rhonchi. He has no rales.  Abdominal: Soft. Bowel sounds are normal. He exhibits no mass. There is no tenderness. There is no rebound and no guarding.  Musculoskeletal: He exhibits no edema.       Lumbar back: He exhibits decreased range of motion, tenderness and pain. He exhibits no bony tenderness, no swelling, no edema, no deformity, no laceration and no spasm.       Arms:      Hands:      Full ROM when bending with mild pain  Full ROM in all other joints  mild tenderness to palpation of the paraspinous muscles on the right side of the thoracic spine No tenderness to palpation of the spinous  processes or paraspinal muscles in other regions of the spine  Lymphadenopathy:    He has no cervical adenopathy.  Neurological: He is alert. He has normal strength and normal reflexes. No cranial nerve deficit or sensory deficit. He exhibits normal muscle tone. GCS eye subscore is 4. GCS verbal subscore is 5. GCS motor subscore is 6.  Reflex Scores:      Patellar reflexes are 2+ on the right side and 2+ on the left side.      Achilles reflexes are  2+ on the right side and 2+ on the left side.      Speech is clear and goal oriented, follows commands Normal strength in upper and lower extremities bilaterally, strong and equal grip strength Sensation normal to light and sharp touch Moves extremities without ataxia, coordination intact Normal balance   Skin: Skin is warm and dry. No rash noted. He is not diaphoretic. There is erythema.       Mild erythema to the nail bed of the right ring finger No swelling in this area No drainage from the site No tenderness to palpation  Psychiatric: He has a normal mood and affect.    ED Course  Procedures (including critical care time)  Labs Reviewed  COMPREHENSIVE METABOLIC PANEL - Abnormal; Notable for the following:    Sodium 133 (*)     Glucose, Bld 116 (*)     AST 39 (*)     GFR calc non Af Amer 88 (*)     All other components within normal limits  CBC WITH DIFFERENTIAL   No results found.   1. Fungal infection of skin   2. Back muscle spasm   3. Strain of thoracic region   4. Onychomycosis       MDM  Doristine Section Patient with back pain.  No neurological deficits and normal neuro exam.  Patient can walk without pain.  No loss of bowel or bladder control.  No concern for cauda equina.  No fever, night sweats, weight loss, h/o cancer, IVDU.  RICE protocol and pain medicine indicated and discussed with patient.  Patient with your edema in the distribution of paronychia but without swelling, pain or signs of bacterial infection at the  nailbed.  Concern for fungal infection of the nailbed.  CMP with normal liver enzymes.  Will start Lamisil and have patient f/u with his PCP for follow-up of his liver enzymes and resolution of the infection.    1. Medications: naprosyn, robaxin, lamisil, usual home medications 2. Treatment: rest, drink plenty of fluids, take medications as prescribed, keep your wound clean and dry, change the bandage frequently 3. Follow Up: Please followup with your primary doctor for discussion of your diagnoses and further evaluation after today's visit; it is important to follow up on the testing for your liver while you are on this medication        Dierdre Forth, PA-C 10/13/12 1859

## 2012-10-14 NOTE — ED Provider Notes (Signed)
Medical screening examination/treatment/procedure(s) were performed by non-physician practitioner and as supervising physician I was immediately available for consultation/collaboration.  Toy Baker, MD 10/14/12 [phone]

## 2012-12-19 ENCOUNTER — Encounter: Payer: Self-pay | Admitting: Internal Medicine

## 2012-12-19 ENCOUNTER — Ambulatory Visit (INDEPENDENT_AMBULATORY_CARE_PROVIDER_SITE_OTHER): Payer: BC Managed Care – PPO | Admitting: Internal Medicine

## 2012-12-19 VITALS — BP 136/80 | HR 84 | Temp 98.2°F | Wt 294.0 lb

## 2012-12-19 DIAGNOSIS — B351 Tinea unguium: Secondary | ICD-10-CM

## 2012-12-19 DIAGNOSIS — R040 Epistaxis: Secondary | ICD-10-CM

## 2012-12-19 DIAGNOSIS — G4733 Obstructive sleep apnea (adult) (pediatric): Secondary | ICD-10-CM

## 2012-12-19 MED ORDER — TERBINAFINE HCL 250 MG PO TABS: 250.0000 mg | ORAL_TABLET | Freq: Every day | ORAL | Status: DC

## 2012-12-19 NOTE — Assessment & Plan Note (Signed)
Patient reports he was diagnosed to 2-3 years ago. He is prescribed CPAP but never used. He would like to retry therapy. Arrange followup appointment with Dr. Maple Hudson.

## 2012-12-19 NOTE — Assessment & Plan Note (Signed)
Since previous visit patient diagnosed with onychomycosis of right fingernails. He was started on Lamisil but did not complete full course of therapy. I suggest patient complete 8 weeks of Lamisil 250 mg once daily. He had baseline LFTs which were normal. Obtain LFTs within one month of starting therapy.

## 2012-12-19 NOTE — Patient Instructions (Addendum)
Use nasal saline and Ayr gel to left nares as directed

## 2012-12-19 NOTE — Progress Notes (Signed)
  Subjective:    Patient ID: [name], male    DOB: 09/07/1951, 62 y.o.   MRN: [id number]  HPI  62 year old white male with history of obesity, and obstructive sleep apnea cmplains of intermittent mild epistaxis for last 1-2 weeks. His symptoms started after getting head cold 3 weeks ago.  Patient denies cough or sinus congestion. He's planning to see his oral surgeon for severe gingivitis of his left upper molars.  Patient also reports he was diagnosed with onychomycosis of his fingernails since previous visit. He has been taking Lamisil 250 mg intermittently. He took for 2 weeks then traveled out of town and forgot his medications. He still has thickened yellow fingernails on right hand.  Objective sleep apnea-he was diagnosed approximately 2 years ago. He was seen by Dr. Joni Fears young. He never started CPAP therapy. He is now interested in retrying CPAP. Patient previously required pressure of 14 cm of water.   Review of Systems Negative for shortness of breath, negative for chronic cough  Past Medical History  Diagnosis Date  . Obesity   . OSA (obstructive sleep apnea)   . GERD (gastroesophageal reflux disease)   . Back pain     chronic    History   Social History  . Marital Status: Single    Spouse Name: N/A    Number of Children: N/A  . Years of Education: N/A   Occupational History  . Not on file.   Social History Main Topics  . Smoking status: Never Smoker   . Smokeless tobacco: Not on file  . Alcohol Use: Yes  . Drug Use: No  . Sexually Active:    Other Topics Concern  . Not on file   Social History Narrative  . No narrative on file    Past Surgical History  Procedure Laterality Date  . Hemangioma excision  1990    left leg    Family History  Problem Relation Age of Onset  . Arthritis Mother     spine  . Heart disease Mother   . Hypertension Mother   . Cancer Mother     bone  . Heart disease Brother     murmur    Allergies  Allergen  Reactions  . Other     Anesthesia "penathol"     Current Outpatient Prescriptions on File Prior to Visit  Medication Sig Dispense Refill  . Multiple Vitamin (MULTIVITAMIN WITH MINERALS) TABS Take 1 tablet by mouth daily.      . naproxen (NAPROSYN) 500 MG tablet Take 1 tablet (500 mg total) by mouth 2 (two) times daily as needed.  60 tablet  0   No current facility-administered medications on file prior to visit.    BP 136/80  Pulse 84  Temp(Src) 98.2 F (36.8 C) (Oral)  Wt 294 lb (133.358 kg)  BMI 41.57 kg/m2       Objective:   Physical Exam  Constitutional:  Pleasant, obese 62 y/o  HENT:  Head: Normocephalic and atraumatic.  Slight oozing from left nares Crowded oropharynx  Cardiovascular: Normal rate, regular rhythm and normal heart sounds.   Pulmonary/Chest: Effort normal and breath sounds normal. He has no wheezes.  Skin: Skin is warm and dry.  Thickened yellow finger nails right hand  Psychiatric: He has a normal mood and affect. His behavior is normal.          Assessment & Plan:

## 2012-12-19 NOTE — Assessment & Plan Note (Signed)
Patient has mild intermittent epistaxis. He had probable viral upper her infection 3 weeks ago. On exam, he has slight oozing near left nares. Patient advised to use intranasal saline and saline gel as directed.

## 2012-12-20 ENCOUNTER — Encounter: Payer: Self-pay | Admitting: Internal Medicine

## 2013-01-28 ENCOUNTER — Institutional Professional Consult (permissible substitution): Payer: BC Managed Care – PPO | Admitting: Internal Medicine

## 2013-01-31 ENCOUNTER — Other Ambulatory Visit: Payer: BC Managed Care – PPO

## 2013-02-20 ENCOUNTER — Encounter: Payer: Self-pay | Admitting: Gastroenterology

## 2013-03-19 ENCOUNTER — Ambulatory Visit (INDEPENDENT_AMBULATORY_CARE_PROVIDER_SITE_OTHER): Payer: BC Managed Care – PPO | Admitting: Family

## 2013-03-19 ENCOUNTER — Encounter: Payer: Self-pay | Admitting: Family

## 2013-03-19 VITALS — BP 142/80 | HR 91 | Temp 98.4°F | Resp 18 | Wt 293.0 lb

## 2013-03-19 DIAGNOSIS — J40 Bronchitis, not specified as acute or chronic: Secondary | ICD-10-CM

## 2013-03-19 MED ORDER — AZITHROMYCIN 250 MG PO TABS: ORAL_TABLET | ORAL | Status: DC

## 2013-03-19 MED ORDER — BENZONATATE 100 MG PO CAPS: 100.0000 mg | ORAL_CAPSULE | Freq: Three times a day (TID) | ORAL | Status: DC | PRN

## 2013-03-19 NOTE — Patient Instructions (Addendum)
Please call if symptoms worsen or if not improved in 2-3 days.   

## 2013-03-19 NOTE — Assessment & Plan Note (Signed)
Rx with zithromax, tessalon, call if symptoms worsen or if not improved in 2-3 days.

## 2013-03-19 NOTE — Progress Notes (Signed)
  Subjective:    Patient ID: [name], male    DOB: [birth year], 62 y.o.   MRN: [id number]  HPI  [name] is a 62 yr old male who presents today with chief complaint of cough. Cough "deep in my chest." Symptoms started about 5 days ago. Denies associated fever. Woke up this AM with sore throat. Denies sinus congestion. Will be flying tomorrow AM.    Review of Systems See HPI  Past Medical History  Diagnosis Date  . Obesity   . OSA (obstructive sleep apnea)   . GERD (gastroesophageal reflux disease)   . Back pain     chronic    History   Social History  . Marital Status: Single    Spouse Name: N/A    Number of Children: N/A  . Years of Education: N/A   Occupational History  . Not on file.   Social History Main Topics  . Smoking status: Never Smoker   . Smokeless tobacco: Not on file  . Alcohol Use: Yes  . Drug Use: No  . Sexually Active:    Other Topics Concern  . Not on file   Social History Narrative  . No narrative on file    Past Surgical History  Procedure Laterality Date  . Hemangioma excision  1990    left leg    Family History  Problem Relation Age of Onset  . Arthritis Mother     spine  . Heart disease Mother   . Hypertension Mother   . Cancer Mother     bone  . Heart disease Brother     murmur    Allergies  Allergen Reactions  . Other     Anesthesia "penathol"     Current Outpatient Prescriptions on File Prior to Visit  Medication Sig Dispense Refill  . Multiple Vitamin (MULTIVITAMIN WITH MINERALS) TABS Take 1 tablet by mouth daily.      . naproxen (NAPROSYN) 500 MG tablet Take 1 tablet (500 mg total) by mouth 2 (two) times daily as needed.  60 tablet  0  . terbinafine (LAMISIL) 250 MG tablet Take 1 tablet (250 mg total) by mouth daily.  30 tablet  1   No current facility-administered medications on file prior to visit.    BP 142/80  Pulse 91  Temp(Src) 98.4 F (36.9 C) (Oral)  Resp 18  Wt 293 lb 0.6 oz (132.922 kg)   BMI 41.44 kg/m2  SpO2 98%       Objective:   Physical Exam  Constitutional: He is oriented to person, place, and time. He appears well-developed and well-nourished. No distress.  HENT:  Head: Normocephalic and atraumatic.  Right Ear: Tympanic membrane and ear canal normal.  Left Ear: Tympanic membrane and ear canal normal.  Mouth/Throat: No oropharyngeal exudate, posterior oropharyngeal edema or posterior oropharyngeal erythema.  Cardiovascular: Normal rate and regular rhythm.   No murmur heard. Pulmonary/Chest: Effort normal and breath sounds normal. No respiratory distress. He has no wheezes. He has no rales. He exhibits no tenderness.  Musculoskeletal: He exhibits no edema.  Neurological: He is alert and oriented to person, place, and time.  Skin: Skin is warm and dry.  Psychiatric: He has a normal mood and affect. His behavior is normal. Judgment and thought content normal.          Assessment & Plan:

## 2013-09-10 ENCOUNTER — Encounter: Payer: BC Managed Care – PPO | Admitting: Family

## 2013-09-11 ENCOUNTER — Encounter: Payer: Self-pay | Admitting: Family

## 2013-09-11 ENCOUNTER — Ambulatory Visit (INDEPENDENT_AMBULATORY_CARE_PROVIDER_SITE_OTHER): Payer: BC Managed Care – PPO | Admitting: Family

## 2013-09-11 VITALS — BP 122/78 | HR 77 | Temp 98.3°F | Resp 18 | Ht 70.0 in | Wt 294.0 lb

## 2013-09-11 DIAGNOSIS — B351 Tinea unguium: Secondary | ICD-10-CM

## 2013-09-11 DIAGNOSIS — B070 Plantar wart: Secondary | ICD-10-CM

## 2013-09-11 DIAGNOSIS — Z5181 Encounter for therapeutic drug level monitoring: Secondary | ICD-10-CM

## 2013-09-11 LAB — HEPATIC FUNCTION PANEL
ALT: 23 U/L (ref 0–53)
Alkaline Phosphatase: 70 U/L (ref 39–117)
Total Protein: 7 g/dL (ref 6.0–8.3)

## 2013-09-11 MED ORDER — TERBINAFINE HCL 250 MG PO TABS: 250.0000 mg | ORAL_TABLET | Freq: Every day | ORAL | Status: DC

## 2013-09-11 NOTE — Patient Instructions (Signed)
Please return to the lab in 6 weeks for follow up liver function test. You will be contacted about your referral to the podiatrist. Schedule your fasting physical at the front desk.

## 2013-09-11 NOTE — Progress Notes (Signed)
  Subjective:    Patient ID: [name], male    DOB: Jul 09, 1951, 62 y.o.   MRN: [id number]  HPI  [name] is a 62 yr old male who presents today with two concerns:  1) Plantar warts- reports previous hx of plantar warts.  Now with plantar wart left foot x 1 year.   2) Nail problem- reports that he had 1/3 of the left great toenail removed 8 years ago following an infected ingrown toenail.  Has some thickened nails on his fingers as well.  Never took rx for lamisil.  Review of Systems See HPI  Past Medical History  Diagnosis Date  . Obesity   . OSA (obstructive sleep apnea)   . GERD (gastroesophageal reflux disease)   . Back pain     chronic    History   Social History  . Marital Status: Single    Spouse Name: N/A    Number of Children: N/A  . Years of Education: N/A   Occupational History  . Not on file.   Social History Main Topics  . Smoking status: Never Smoker   . Smokeless tobacco: Never Used  . Alcohol Use: Yes  . Drug Use: No  . Sexual Activity: Not on file   Other Topics Concern  . Not on file   Social History Narrative  . No narrative on file    Past Surgical History  Procedure Laterality Date  . Hemangioma excision  1990    left leg    Family History  Problem Relation Age of Onset  . Arthritis Mother     spine  . Heart disease Mother   . Hypertension Mother   . Cancer Mother     bone  . Heart disease Brother     murmur    Allergies  Allergen Reactions  . Other     Anesthesia "penathol"     No current outpatient prescriptions on file prior to visit.   No current facility-administered medications on file prior to visit.    BP 122/78  Pulse 77  Temp(Src) 98.3 F (36.8 C) (Oral)  Resp 18  Ht 5\' 10"  (1.778 m)  Wt 294 lb (133.358 kg)  BMI 42.18 kg/m2  SpO2 97%       Objective:   Physical Exam  Constitutional: He is oriented to person, place, and time. He appears well-developed and well-nourished. No distress.   HENT:  Head: Normocephalic and atraumatic.  Cardiovascular: Normal rate and regular rhythm.   No murmur heard. Pulmonary/Chest: Effort normal and breath sounds normal. No respiratory distress. He has no wheezes. He has no rales. He exhibits no tenderness.  Neurological: He is alert and oriented to person, place, and time.  Skin:  + plantar wart left sole at base of left fifth toe  R great toe and R second toenail are thickened.  Left great and left small toenail are thickened.  Toenail left great toe is curled.         Assessment & Plan:

## 2013-09-12 DIAGNOSIS — B070 Plantar wart: Secondary | ICD-10-CM | POA: Insufficient documentation

## 2013-09-12 NOTE — Assessment & Plan Note (Signed)
Trial of lamisil. LFT normal- repeat LFT in 6 weeks.

## 2013-09-12 NOTE — Assessment & Plan Note (Signed)
Thickened skin overlying left plantar wart was debrided using a sterile blade.  Then wart was frozen and allowed to thaw 3 times with liquid nitrogen.  Pt tolerated procedure without difficulty.  He would like referral to podiatry for evaluation of his curling toenails and for trimming.

## 2013-09-25 ENCOUNTER — Telehealth: Payer: Self-pay | Admitting: *Deleted

## 2013-09-25 DIAGNOSIS — Z5181 Encounter for therapeutic drug level monitoring: Secondary | ICD-10-CM

## 2013-09-25 NOTE — Telephone Encounter (Signed)
Message copied by Kathi Simpers on Wed Sep 25, 2013  7:53 AM ------      Message from: O'SULLIVAN, MELISSA      Created: Wed Sep 11, 2013  2:26 PM       Pt will return to lab in 6 weeks for lft, dx therapeutic drug monitoring. thanks ------

## 2013-09-25 NOTE — Telephone Encounter (Signed)
Lab order has been entered  

## 2013-10-09 ENCOUNTER — Ambulatory Visit (INDEPENDENT_AMBULATORY_CARE_PROVIDER_SITE_OTHER): Payer: BC Managed Care – PPO | Admitting: Physician Assistant

## 2013-10-09 ENCOUNTER — Encounter: Payer: Self-pay | Admitting: Physician Assistant

## 2013-10-09 VITALS — BP 126/82 | HR 68 | Temp 98.0°F | Resp 18 | Ht 70.0 in | Wt 297.0 lb

## 2013-10-09 DIAGNOSIS — B070 Plantar wart: Secondary | ICD-10-CM

## 2013-10-09 DIAGNOSIS — M546 Pain in thoracic spine: Secondary | ICD-10-CM

## 2013-10-09 NOTE — Progress Notes (Signed)
Patient ID: [name], male   DOB: 1951/06/04, 62 y.o.   MRN: [id number]  Patient presents to clinic today complaining of low back pain of midthoracic back x1 week. Patient endorses history of back pain over the past 2 years. Endorses occasional flareups. States his pain is worse with movement and improved with rest. Patient denies low back pain. Patient denies shoulder pain or neck pain. Patient has taken over-the-counter ibuprofen for his symptoms. Patient also presents complaining of plantar wart on several foot. Was recently seen for this and wart removed via cryotherapy. Patient states he feels like the wart is coming back. Would like it to be evaluated.    Past Medical History  Diagnosis Date  . Obesity   . OSA (obstructive sleep apnea)   . GERD (gastroesophageal reflux disease)   . Back pain     chronic    Current Outpatient Prescriptions on File Prior to Visit  Medication Sig Dispense Refill  . terbinafine (LAMISIL) 250 MG tablet Take 1 tablet (250 mg total) by mouth daily.  30 tablet  1   No current facility-administered medications on file prior to visit.    Allergies  Allergen Reactions  . Other     Anesthesia "penathol"     Family History  Problem Relation Age of Onset  . Arthritis Mother     spine  . Heart disease Mother   . Hypertension Mother   . Cancer Mother     bone  . Heart disease Brother     murmur    History   Social History  . Marital Status: Single    Spouse Name: N/A    Number of Children: N/A  . Years of Education: N/A   Social History Main Topics  . Smoking status: Never Smoker   . Smokeless tobacco: Never Used  . Alcohol Use: Yes  . Drug Use: No  . Sexual Activity: None   Other Topics Concern  . None   Social History Narrative  . None    Review of Systems - see history of present illness. All other review of systems are negative.   Filed Vitals:   10/09/13 1707  BP: 126/82  Pulse: 68  Temp: 98 F (36.7 C)  Resp: 18    Physical Exam  Vitals reviewed. Constitutional: He is oriented to person, place, and time and well-developed, well-nourished, and in no distress.  HENT:  Head: Normocephalic and atraumatic.  Eyes: Conjunctivae are normal. Pupils are equal, round, and reactive to light.  Neck: Neck supple.  Cardiovascular: Normal rate, regular rhythm and normal heart sounds.   Pulmonary/Chest: Effort normal and breath sounds normal. No respiratory distress. He has no wheezes. He has no rales. He exhibits no tenderness.  Musculoskeletal:       Cervical back: Normal.       Thoracic back: He exhibits tenderness, pain and spasm. He exhibits normal range of motion, no bony tenderness, no swelling and no deformity.       Lumbar back: Normal.  Neurological: He is alert and oriented to person, place, and time.  Skin: Skin is warm and dry.  Plantar wart noted on sole of left foot  Psychiatric: Affect normal.   Recent Results (from the past 2160 hour(s))  HEPATIC FUNCTION PANEL     Status: None   Collection Time    09/11/13 12:01 AM      Result Value Range   Total Bilirubin 0.6  0.3 - 1.2 mg/dL   Bilirubin, Direct  0.1  0.0 - 0.3 mg/dL   Indirect Bilirubin 0.5  0.0 - 0.9 mg/dL   Alkaline Phosphatase 70  39 - 117 U/L   AST 25  0 - 37 U/L   ALT 23  0 - 53 U/L   Total Protein 7.0  6.0 - 8.3 g/dL   Albumin 4.1  3.5 - 5.2 g/dL    Assessment/Plan: Plantar wart of left foot Treated with cryotherapy. Patient to return for further cryotherapy treatments, to ensure resolution of warts. Also discussed possible removal by podiatry.  Back pain, thoracic Ibuprofen. Topical Aspercreme. Compresses. Rest. Avoid heavy lifting. Patient declines muscle relaxer. Return to clinic if symptoms do not improve over the next week.

## 2013-10-09 NOTE — Patient Instructions (Signed)
Ibuprofen, topical Aspercreme, Rest and warm compresses to affected area for back pain.  Please follow-up with Podiatry for removal of wart.

## 2013-10-09 NOTE — Progress Notes (Signed)
Pre visit review using our clinic review tool, if applicable. No additional management support is needed unless otherwise documented below in the visit note. 

## 2013-10-12 DIAGNOSIS — M546 Pain in thoracic spine: Secondary | ICD-10-CM | POA: Insufficient documentation

## 2013-10-12 NOTE — Assessment & Plan Note (Signed)
Treated with cryotherapy. Patient to return for further cryotherapy treatments, to ensure resolution of warts. Also discussed possible removal by podiatry.

## 2013-10-12 NOTE — Assessment & Plan Note (Signed)
Ibuprofen. Topical Aspercreme. Compresses. Rest. Avoid heavy lifting. Patient declines muscle relaxer. Return to clinic if symptoms do not improve over the next week.

## 2013-10-31 HISTORY — PX: KNEE SURGERY: SHX244

## 2014-02-25 ENCOUNTER — Telehealth: Payer: Self-pay | Admitting: *Deleted

## 2014-02-25 ENCOUNTER — Ambulatory Visit (HOSPITAL_BASED_OUTPATIENT_CLINIC_OR_DEPARTMENT_OTHER)
Admission: RE | Admit: 2014-02-25 | Discharge: 2014-02-25 | Disposition: A | Discharge status: Home / Self Care | Payer: BC Managed Care – PPO | Source: Ambulatory Visit | Attending: Family | Admitting: Family

## 2014-02-25 ENCOUNTER — Encounter: Payer: Self-pay | Admitting: Family

## 2014-02-25 ENCOUNTER — Ambulatory Visit (INDEPENDENT_AMBULATORY_CARE_PROVIDER_SITE_OTHER): Payer: BC Managed Care – PPO | Admitting: Family

## 2014-02-25 VITALS — BP 124/82 | HR 74 | Temp 98.2°F | Resp 16 | Ht 70.0 in | Wt 289.1 lb

## 2014-02-25 DIAGNOSIS — I8290 Acute embolism and thrombosis of unspecified vein: Secondary | ICD-10-CM

## 2014-02-25 DIAGNOSIS — I749 Embolism and thrombosis of unspecified artery: Secondary | ICD-10-CM

## 2014-02-25 DIAGNOSIS — L600 Ingrowing nail: Secondary | ICD-10-CM | POA: Insufficient documentation

## 2014-02-25 DIAGNOSIS — R209 Unspecified disturbances of skin sensation: Secondary | ICD-10-CM | POA: Insufficient documentation

## 2014-02-25 DIAGNOSIS — I809 Phlebitis and thrombophlebitis of unspecified site: Secondary | ICD-10-CM | POA: Insufficient documentation

## 2014-02-25 NOTE — Patient Instructions (Addendum)
Please complete doppler on the first floor. You will be contacted about your referral to the podiatrist. Please schedule a physical at your convenience.

## 2014-02-25 NOTE — Progress Notes (Signed)
Subjective:    Patient ID: [name], male    DOB: 06/05/1951, 63 y.o.   MRN: [id number]  HPI  [name] is a 63 yr old male who presents today with several concerns:  1) ? Phlebitis- right leg.  Reports that he had a spot on his right leg which was sensitive to the touch back in 12/16/22.  Father passed away and he drove for 8 hours.  Had continued tenderness in the right leg which worsened over the next 2 days.  On the third day he had painful/throbbing right leg.  Sister advised him to go to the ER. Reports that he had an ultrasound that that there was thrombosis but not "deep." Was told to wear support stockings/mid thigh which he has been doing on both legs.  His visit to the ER was on 01/06/14.  Reports that he has very little pain.  He now has an area in the back of his right thigh which "stings."  R foot- laterally + thickening of skin.   L great toe, ingrown toenail-  2002 had infected ingrown toenail.  Went to a Psychologist, sport and exercise who removed 1/3 of the nail.  (reports he did not follow through with podiatry referral last year)  Review of Systems    see HPI  Past Medical History  Diagnosis Date  . Obesity   . OSA (obstructive sleep apnea)   . GERD (gastroesophageal reflux disease)   . Back pain     chronic    History   Social History  . Marital Status: Single    Spouse Name: N/A    Number of Children: N/A  . Years of Education: N/A   Occupational History  . Not on file.   Social History Main Topics  . Smoking status: Never Smoker   . Smokeless tobacco: Never Used  . Alcohol Use: Yes  . Drug Use: No  . Sexual Activity: Not on file   Other Topics Concern  . Not on file   Social History Narrative  . No narrative on file    Past Surgical History  Procedure Laterality Date  . Hemangioma excision  1990    left leg    Family History  Problem Relation Age of Onset  . Arthritis Mother     spine  . Heart disease Mother   . Hypertension Mother   . Cancer  Mother     bone  . Heart disease Brother     murmur    Allergies  Allergen Reactions  . Other     Anesthesia "penathol"     No current outpatient prescriptions on file prior to visit.   No current facility-administered medications on file prior to visit.    BP 124/82  Pulse 74  Temp(Src) 98.2 F (36.8 C) (Oral)  Resp 16  Ht 5\' 10"  (1.778 m)  Wt 289 lb 1.4 oz (131.13 kg)  BMI 41.48 kg/m2  SpO2 96%    Objective:   Physical Exam  Constitutional: He appears well-developed and well-nourished. No distress.  Cardiovascular: Normal rate and regular rhythm.   No murmur heard. Some varicose veins noted in the right leg  Pulmonary/Chest: Effort normal and breath sounds normal. No respiratory distress. He has no wheezes. He has no rales. He exhibits no tenderness.  Skin:  + corn noted right lateral foot/plantar surface   L great toenail is very curved, noted to be ingrown medially without erythema or swelling. Hypertrophic toenails  Assessment & Plan:

## 2014-02-25 NOTE — Telephone Encounter (Signed)
Received call from radiology with right lower extremity doppler result. No DVT, superficial thrombophlebitis right knee/calf area.  Advised pt per verbal from Provider to continue thigh stockings, elevate feet whenever possible and may apply warm compresses every 15 min if have pain/discomfort in that area. Advised pt if pain does not improve to let us know as he may need referral to vascular. Pt voices understanding.

## 2014-02-25 NOTE — Assessment & Plan Note (Signed)
Will refer to podiatry.  No sign of infection at this time. Advised pt to call if redness/swelling occurs prior to podiatry visit.

## 2014-02-25 NOTE — Assessment & Plan Note (Signed)
Per pt report, records are currently unavailable.  Will obtain LLE doppler to further evaluate.

## 2014-02-26 ENCOUNTER — Encounter: Payer: Self-pay | Admitting: Podiatry

## 2014-02-26 ENCOUNTER — Ambulatory Visit (INDEPENDENT_AMBULATORY_CARE_PROVIDER_SITE_OTHER): Payer: BC Managed Care – PPO | Admitting: Podiatry

## 2014-02-26 VITALS — BP 149/87 | HR 60 | Resp 12

## 2014-02-26 DIAGNOSIS — M216X9 Other acquired deformities of unspecified foot: Secondary | ICD-10-CM

## 2014-02-26 DIAGNOSIS — L84 Corns and callosities: Secondary | ICD-10-CM

## 2014-02-26 DIAGNOSIS — L600 Ingrowing nail: Secondary | ICD-10-CM

## 2014-02-26 NOTE — Progress Notes (Signed)
Subjective:     Patient ID: [name], male   DOB: 15-Dec-1950, 63 y.o.   MRN: [id number]  HPI patient states that my left big toenail is sore and I have tried to trim it and soak it without relief of symptoms. Been bothering him for long time the worse over the last few weeks   Review of Systems  All other systems reviewed and are negative.      Objective:   Physical Exam  Nursing note and vitals reviewed. Constitutional: He is oriented to person, place, and time.  Cardiovascular: Intact distal pulses.   Musculoskeletal: Normal range of motion.  Neurological: He is oriented to person, place, and time.  Skin: Skin is warm.   neurovascular status intact with muscle strength adequate and range of motion within normal limits subtalar midtarsal joint. Digits found to be well-perfused with well-developed arch and left hallux nail is found to be severely curved with an incurvated painful left lateral border with slight distal redness     Assessment:     Ingrown toenail deformity with structural issues left hallux nail lateral border    Plan:     H&P reviewed in condition explained. I have recommended removal of the corner per minute and explained the surgery to the patient including chances for complications. Patient wants procedure and at this time I infiltrated 60 mg Xylocaine Marcaine mixture remove the lateral border exposed matrix and applied phenol 3 applications 30 seconds followed by alcohol lavaged and sterile dressing. Gave instructions on soaks and reappoint

## 2014-02-26 NOTE — Patient Instructions (Signed)

## 2014-02-26 NOTE — Progress Notes (Signed)
   Subjective:    Patient ID: [name], male    DOB: 1950/12/20, 63 y.o.   MRN: [id number]  HPI PT STATED LT FOOT GREAT TOENAIL IS BEEN SORE FOR 3 WEEKS. THE TOENAIL IS GETTING WORSE. THE TOENAIL GET AGGRAVATED BY PRESSURE. TRIED TRIM IT DOWN BUT ITS GROWING BACK.''   Review of Systems  All other systems reviewed and are negative.      Objective:   Physical Exam        Assessment & Plan:

## 2014-06-24 ENCOUNTER — Telehealth: Payer: Self-pay | Admitting: Family

## 2014-06-24 NOTE — Telephone Encounter (Signed)
I received medical clearance letter from [facility].  Pt will need 30 min appointment prior to clearance for pre-op evaluation.

## 2014-06-24 NOTE — Telephone Encounter (Signed)
Advised pt of need for appointment. He states he is out of town until 2 days prior to surgery. Advised pt I can forward clearance form to him if he wants to get clearance done by a doctor closer to his current location or we will need to see him in our office earlier than expected return date in order to complete clearance on time. Pt will call me back and let me know how he wants to proceed.

## 2014-08-18 ENCOUNTER — Ambulatory Visit: Payer: BC Managed Care – PPO | Admitting: Family

## 2014-08-18 ENCOUNTER — Telehealth: Payer: Self-pay | Admitting: *Deleted

## 2014-08-18 NOTE — Telephone Encounter (Signed)
Patient arrived 50 minutes late for appointment and was rescheduled. Please do not charge no show.

## 2014-08-18 NOTE — Telephone Encounter (Signed)
See below

## 2014-08-18 NOTE — Telephone Encounter (Signed)
Pt did not show for appointment 08/18/2014 at [facility] for surgical clearance

## 2014-08-21 ENCOUNTER — Ambulatory Visit (INDEPENDENT_AMBULATORY_CARE_PROVIDER_SITE_OTHER)
Admission: RE | Admit: 2014-08-21 | Discharge: 2014-08-21 | Disposition: A | Discharge status: Home / Self Care | Payer: BC Managed Care – PPO | Source: Ambulatory Visit | Attending: Internal Medicine | Admitting: Internal Medicine

## 2014-08-21 ENCOUNTER — Encounter: Payer: Self-pay | Admitting: Internal Medicine

## 2014-08-21 ENCOUNTER — Ambulatory Visit (INDEPENDENT_AMBULATORY_CARE_PROVIDER_SITE_OTHER): Payer: BC Managed Care – PPO | Admitting: Internal Medicine

## 2014-08-21 VITALS — BP 132/80 | HR 66 | Temp 98.3°F | Resp 14 | Wt 291.5 lb

## 2014-08-21 DIAGNOSIS — S8991XS Unspecified injury of right lower leg, sequela: Secondary | ICD-10-CM

## 2014-08-21 DIAGNOSIS — G4733 Obstructive sleep apnea (adult) (pediatric): Secondary | ICD-10-CM

## 2014-08-21 DIAGNOSIS — Z01818 Encounter for other preprocedural examination: Secondary | ICD-10-CM

## 2014-08-21 DIAGNOSIS — I809 Phlebitis and thrombophlebitis of unspecified site: Secondary | ICD-10-CM

## 2014-08-21 NOTE — Progress Notes (Signed)
   Subjective:    Patient ID: [name], male    DOB: May 12, 1951, 63 y.o.   MRN: [id number]  HPI  In April of this year while working in [place], [place] he lifted a box and turned his body. His right foot was planted and he incurred severe twisting of the right knee. He stated that he heard an audible pop with subsequent pain and swelling.  MRI was performed. No specific treatment or intervention was pursued.  He continues to have swelling and intermittent pain particularly with rotation of the knee. Dr. Veverly Fells has reviewed the MRI and feels that arthroplasty is needed . Surgery is planned in an out patient arena with local anesthesia & conscious sedation , not general sedation based on his understanding.  Significant past history includes sleep apnea. He believes he was officially tested in 2008. He employed the CPAP for 4 years but discontinued it because of its "inconvenience".  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  He is on prophylactic 81 mg aspirin daily. With flights he increases his dose to 325 mg.  His mother had pulmonary thromboembolism at the age of 34 following a fracture of the left lower extremity.    Review of Systems  He denies any active cardiopulmonary symptoms @ this time.Specifically chest pain, palpitations, tachycardia, exertional dyspnea, paroxysmal nocturnal dyspnea, claudication or edema are absent. PMH includes description of "palpitations" by Dr Shawna Orleans in 2010.       Objective:   Physical Exam   Positive or pertinent findings include: Pattern alopecia is present. The oropharynx is markedly crowded. There is extremely poor visualization of the posterior pharynx. Abdomen is markedly protuberant. As per CDC guidelines he has severe obesity. His isolated fungal fingernail changes There is thickening and deformity of the toenails particularly the great toenails and of  several toenails of the right foot.  General appearance :adequately nourished; in no distress. Eyes: No conjunctival inflammation or scleral icterus is present. Oral exam: Dental hygiene is good. Lips and gums are healthy appearing. Heart:  Normal rate and regular rhythm. S1 and S2 normal without gallop, murmur, click, rub or other extra sounds   Lungs:Chest clear to auscultation; no wheezes, rhonchi,rales ,or rubs present.No increased work of breathing.  Abdomen: bowel sounds normal, soft and non-tender without masses, organomegaly or hernias noted.  No guarding or rebound.  Vascular : all pulses equal ; no bruits present. Skin:Warm & dry.  Intact without suspicious lesions or rashes ; no jaundice or tenting Lymphatic: No lymphadenopathy is noted about the head, neck, axilla            Assessment & Plan:  #1 knee injury with soft tissue damage causing pain & swelling  #2 sleep apnea, untreated. PMH of palpitations.Potential for significant peri operative cardiopulmonary complications(respiratory compromise & dysrhythmias) #3 severe obesity #4 EKG reveals nonspecific ST-T changes in leads 1 and aVL. There is poor R wave progression across the precordium without ischemic change. He also has intra-atrial conduction delay. No dysrhythmias noted. Compared to 05/08/09 the R wave progression across the precordium is poorer. This may represent lead placement. Plan: Sleep Medicine reassessment Chest Xray

## 2014-08-21 NOTE — Progress Notes (Signed)
Pre visit review using our clinic review tool, if applicable. No additional management support is needed unless otherwise documented below in the visit note. 

## 2014-08-21 NOTE — Patient Instructions (Addendum)
   Because of a history of sleep apnea which has not been actively addressed; reassessment preoperatively would be indicated. This is especially true as there may be limited medical supervision in an outpatient facility.  Take the EKG to any emergency room or preop visits. There are nonspecific changes; as long as there is no new change these are not clinically significant . If the old EKG is not available for comparison; it may result in unnecessary hospitalization for observation with significant unnecessary expense.

## 2014-08-22 ENCOUNTER — Other Ambulatory Visit: Payer: Self-pay | Admitting: Internal Medicine

## 2014-08-22 DIAGNOSIS — G4733 Obstructive sleep apnea (adult) (pediatric): Secondary | ICD-10-CM

## 2014-08-26 ENCOUNTER — Ambulatory Visit (INDEPENDENT_AMBULATORY_CARE_PROVIDER_SITE_OTHER): Payer: Worker's Compensation | Admitting: Internal Medicine

## 2014-08-26 ENCOUNTER — Encounter: Payer: Self-pay | Admitting: Internal Medicine

## 2014-08-26 VITALS — BP 130/72 | HR 77 | Ht 70.0 in | Wt 294.0 lb

## 2014-08-26 DIAGNOSIS — G4733 Obstructive sleep apnea (adult) (pediatric): Secondary | ICD-10-CM

## 2014-08-26 NOTE — Patient Instructions (Signed)
Place request for old record- need my old records from [facility]  Order- schedule Unattended home sleep study    Dx OSA

## 2014-08-26 NOTE — Progress Notes (Signed)
08/26/14- 16 yoM never smoker referred courtesy of Dr Linna Darner- Pt needs surgical clearance for R knee repair, date is tenative depending on this.  Pt was put on cpap in 2008, has not worn it in 4 years.  Last sleep study in '08 at [facility](?) , cpap monitored then by Dr. Annamaria Boots.   He says he has lost 25 pounds since treated for sleep apnea although he remains very heavy. Bedtime between 10 and 11 PM, sleep latency 5 minutes, waking once or twice before up around 5 or 6 AM. He flies back and forth to the [facility] about 14 times a year for his job [employer]. Family history the father has central sleep apnea with multiple medical problems. Surgical history of tonsillectomy. Medical history significant for VTE, morbid obesity GERD.  Prior to Admission medications   Medication Sig Start Date End Date Taking? Authorizing Provider  aspirin EC 81 MG tablet Take 81 mg by mouth daily.   Yes Historical Provider, MD  Naproxen Sodium (ALEVE) 220 MG CAPS Take 1 capsule by mouth as needed (knee pain).   Yes Historical Provider, MD   Past Medical History  Diagnosis Date  . Obesity   . OSA (obstructive sleep apnea)   . GERD (gastroesophageal reflux disease)   . Back pain     chronic   Past Surgical History  Procedure Laterality Date  . Hemangioma excision  1990    left leg   Family History  Problem Relation Age of Onset  . Arthritis Mother     spine  . Heart disease Mother   . Hypertension Mother   . Cancer Mother     bone  . Heart disease Brother     murmur   History   Social History  . Marital Status: Single    Spouse Name: N/A    Number of Children: N/A  . Years of Education: N/A   Occupational History  . Not on file.   Social History Main Topics  . Smoking status: Never Smoker   . Smokeless tobacco: Never Used  . Alcohol Use: Yes  . Drug Use: No  . Sexual Activity: Not on file   Other Topics Concern  . Not on file   Social History Narrative  . No narrative  on file   ROS-see HPI Constitutional:   No-   weight loss, night sweats, fevers, chills, + fatigue, lassitude. HEENT:   No-  headaches, difficulty swallowing, tooth/dental problems, sore throat,       No-  sneezing, itching, ear ache, nasal congestion, post nasal drip,  CV:  No-   chest pain, orthopnea, PND, swelling in lower extremities, anasarca,                                  dizziness, palpitations Resp: + shortness of breath with exertion or at rest.              No-   productive cough,  No non-productive cough,  No- coughing up of blood.              No-   change in color of mucus.  No- wheezing.   Skin: No-   rash or lesions. GI:  No-   heartburn, indigestion, abdominal pain, nausea, vomiting, diarrhea,                 change in bowel habits, loss of  appetite GU: No-   dysuria, change in color of urine, no urgency or frequency.  No- flank pain. MS:  No-   joint pain or swelling.  No- decreased range of motion. + back pain. Neuro-     nothing unusual Psych:  No- change in mood or affect. No depression or anxiety.  No memory loss.  OBJ- Physical Exam General- Alert, Oriented, Affect-appropriate, Distress- none acute, + morbidly obese Skin- rash-none, lesions- none, excoriation- none Lymphadenopathy- none Head- atraumatic            Eyes- Gross vision intact, PERRLA, conjunctivae and secretions clear            Ears- Hearing, canals-normal            Nose- + mucus bridging, no-Septal dev,polyps, erosion, perforation             Throat- Mallampati III , mucosa clear , drainage- none, tonsils- atrophic Neck- flexible , trachea midline, no stridor , thyroid nl, carotid no bruit Chest - symmetrical excursion , unlabored           Heart/CV- RRR , no murmur , no gallop  , no rub, nl s1 s2                           - JVD- none , edema- none, stasis changes- none, varices- none           Lung- clear to P&A, wheeze- none, cough- none , dullness-none, rub- none           Chest wall-   Abd- tender-no, distended-no, bowel sounds-present, HSM- no Br/ Gen/ Rectal- Not done, not indicated Extrem- cyanosis- none, clubbing, none, atrophy- none, strength- nl Neuro- grossly intact to observation

## 2014-08-26 NOTE — Assessment & Plan Note (Signed)
We will request old records from warehouse but they are likely too old to be useful Plan-he will be an appropriate candidate for an unattended sleep study to reestablish diagnosis and I think we can get that done quickly enough to be useful before his surgery. If not I can recommend to his surgeon that he wear auto titration BIPAP when sedated and not being monitored by anesthesia, until we can get his sleep study done

## 2014-08-26 NOTE — Assessment & Plan Note (Signed)
Weight loss is a significant medical goal. Consider bariatric referral

## 2014-08-29 ENCOUNTER — Other Ambulatory Visit: Payer: Self-pay | Admitting: Internal Medicine

## 2014-08-29 ENCOUNTER — Telehealth: Payer: Self-pay | Admitting: Internal Medicine

## 2014-08-29 ENCOUNTER — Encounter: Payer: Self-pay | Admitting: Internal Medicine

## 2014-08-29 DIAGNOSIS — G4733 Obstructive sleep apnea (adult) (pediatric): Secondary | ICD-10-CM

## 2014-08-29 NOTE — Telephone Encounter (Signed)
CY, please advise if you have sent any records on this patient. I do see a closed OV note but unsure if there are other records you were wanting to have sent as well. Thanks.

## 2014-09-01 ENCOUNTER — Telehealth: Payer: Self-pay | Admitting: Internal Medicine

## 2014-09-01 NOTE — Telephone Encounter (Signed)
We have scanned is sleep study from 2008 documenting OSA. We were asking [facility] to determine through DME whether they could still accept this old study and go forward issuing new CPAP with autotitration. What is the status of that. I wanted to clear him for surgery with a specific CPAP pressure if available. Otherwise I will clear him with auto PAP

## 2014-09-01 NOTE — Telephone Encounter (Signed)
Would like to speak with you about surgery clearance.  Is patient cleared after next sleep study.  Needs something in writing faxed to [phone].

## 2014-09-01 NOTE — Telephone Encounter (Signed)
   Medical clearance depends on Dr. Keturah Barre completing his sleep apnea workup. If he clears you; I have no reservations.

## 2014-09-01 NOTE — Telephone Encounter (Signed)
Pt called in and is unclear what to do next.  Request to speak with Linna Darner about the next step, where he goes from here.      Best number to   [phone]

## 2014-09-01 NOTE — Telephone Encounter (Signed)
Notified Kelly with md response...Johny Chess

## 2014-09-01 NOTE — Telephone Encounter (Signed)
Santiago Glad returning call on this pt, please let her know if she needs to send something over, she can be reached @ this # [phone].Hillery Hunter

## 2014-09-02 ENCOUNTER — Other Ambulatory Visit: Payer: Self-pay | Admitting: Internal Medicine

## 2014-09-02 ENCOUNTER — Ambulatory Visit (HOSPITAL_BASED_OUTPATIENT_CLINIC_OR_DEPARTMENT_OTHER): Payer: Self-pay

## 2014-09-02 DIAGNOSIS — G4733 Obstructive sleep apnea (adult) (pediatric): Secondary | ICD-10-CM

## 2014-09-02 NOTE — Telephone Encounter (Signed)
Lenor Coffin (pt's workman's comp carrier) faxed over information stating that "this letter is authorization for NPSG ordered by Dr. Annamaria Boots. I have been trying to reach Ms Ebony Hail with [facility] at [phone] ext [phone]. Dr. Annamaria Boots has ordered a Split Night Study (procedure code [phone]) and I need to speak with her to see if this is covered under WC. I have made multiple attempts to contact Ms. Ebony Hail, however, every time I try to call a recording comes on stating "we are experiencing a high call volume at this time to try your call at a later time". I have been trying since 9:00 this morning and it is now 2:35 pm, without speaking with her.  I contacted the patient and he is aware of the study that is scheduled for Thurs 09/04/14 at 8:00 at [facility]. I have asked patient to try to get in touch with her and to have her contact me at [phone]. Pt stated that he would continue to try to get in touch with her. I need formal letter to authorize the split night by the [place]. Rhonda J Cobb

## 2014-09-03 NOTE — Telephone Encounter (Signed)
Spoke with Lenor Coffin today with [facility] Comp and she has approved the Split Night sleep study instead of the NPSG. She will fax over another letter stating the approval. Per Lenor Coffin Workman's Comp will cover the Sleep Study, but if patient needs CPAP that will need to be filed under patient's commercial insurance. Will fax over letter to the sleep lab once received for their records. Nothing else needed at this time. Rhonda J Cobb

## 2014-09-04 ENCOUNTER — Ambulatory Visit (HOSPITAL_BASED_OUTPATIENT_CLINIC_OR_DEPARTMENT_OTHER): Payer: Worker's Compensation | Attending: Internal Medicine

## 2014-09-04 VITALS — Ht 70.0 in | Wt 285.0 lb

## 2014-09-04 DIAGNOSIS — G4733 Obstructive sleep apnea (adult) (pediatric): Secondary | ICD-10-CM | POA: Diagnosis not present

## 2014-09-04 DIAGNOSIS — Z6841 Body Mass Index (BMI) 40.0 and over, adult: Secondary | ICD-10-CM | POA: Insufficient documentation

## 2014-09-04 DIAGNOSIS — G4730 Sleep apnea, unspecified: Secondary | ICD-10-CM | POA: Diagnosis present

## 2014-09-04 DIAGNOSIS — G4710 Hypersomnia, unspecified: Secondary | ICD-10-CM | POA: Diagnosis present

## 2014-09-06 DIAGNOSIS — G4733 Obstructive sleep apnea (adult) (pediatric): Secondary | ICD-10-CM

## 2014-09-06 NOTE — Sleep Study (Unsigned)
   NAME: [name] DATE OF BIRTH:  09-23-1951 MEDICAL RECORD NUMBER [id number]  LOCATION: [facility]  PHYSICIAN: Clarkson Rosselli D  DATE OF STUDY: 09/04/2014  SLEEP STUDY TYPE: Nocturnal Polysomnogram               REFERRING PHYSICIAN: Baird Lyons D, MD  INDICATION FOR STUDY: hypersomnia with sleep apnea  EPWORTH SLEEPINESS SCORE:  8/24 HEIGHT: 5\' 10"  (177.8 cm)  WEIGHT: 285 lb (129.275 kg)    Body mass index is 40.89 kg/(m^2).  NECK SIZE: 18 in.  MEDICATIONS: charted for review   SLEEP ARCHITECTURE: split study protocol. During the diagnostic phase, total sleep time 140.5 minutes with sleep efficiency 90%. Stage I was 16.2%, stage II 79.4%, stage III absent, REM 4.3% of total sleep time. Sleep latency 2.5 minutes, REM latency 27 minutes, awake after sleep onset 4.5 minutes, arousal index 1.9, bedtime medication: None  RESPIRATORY DATA: apnea hypopnea index (AHI) 29.9 per hour. 63 total events scored including 28 obstructive apneas and 35 hypopneas. Events were more common while supine. REM AHI 76.4 per hour. CPAP titration to 13 CWP, AHI 0.8 per hour. He wore a large fullface mask.  OXYGEN DATA: Moderate snoring before CPAP with oxygen desaturation to a nadir of 85% on room air. With CPAP control, snoring was prevented and mean oxygen saturation was 94.5% on room air.  CARDIAC DATA: sinus rhythm  MOVEMENT/PARASOMNIA: no significant movement disturbance, bathroom 1  IMPRESSION/ RECOMMENDATION:   1) Moderate to severe obstructive sleep apnea/hypopnea syndrome, AHI 29.9 per hour. Events were more common while supine. REM AHI 76.4 per hour. Moderate snoring with oxygen desaturation to a nadir of 85% on room air. 2) Successful CPAP titration to 13 CWP, AHI 0.8 per hour. He wore a large [employer] Simplus fullface mask with heated humidifier. Snoring was prevented and mean oxygen saturation was 94.5% on room air.   [place], American  Board of Sleep Medicine  ELECTRONICALLY SIGNED ON:  09/06/2014, 12:11 PM [place] PH: [phone]   FX: [phone] [place]

## 2014-09-08 ENCOUNTER — Telehealth: Payer: Self-pay | Admitting: Internal Medicine

## 2014-09-08 DIAGNOSIS — G4733 Obstructive sleep apnea (adult) (pediatric): Secondary | ICD-10-CM

## 2014-09-08 NOTE — Telephone Encounter (Signed)
Dr Annamaria Boots, please advise if you have read the PSG yet, thanks

## 2014-09-09 NOTE — Telephone Encounter (Signed)
Spoke with patient, aware of results and rec's per CY. Order placed for new CPAP and supplies.  Pt states that he will call back once receiving machine to set up appt with CY.  Requests that OV notes be sent to Dr Veverly Fells along with a letter from Dr Annamaria Boots.   Please advise Dr Annamaria Boots. Thanks.

## 2014-09-09 NOTE — Telephone Encounter (Signed)
Sleep study report is in notes. It confirmed severe obstructive sleep apnea, and indicated CPAP 13 should work well.  Recommend order New DME, CPAP 13, mask of choice, humidifier, supplies, for dx OSA We will need to see him back in the 30-90 day insurance follow-up window after getting CPAP- about 2 months.  He should wear CPAP when asleep through his surgical stay, but otherwise can go ahead and get his orthopedic surgery done.  Does he need me to send a note to his orthopedic surgeon? I think that was Dr Netta Cedars- need to verify.

## 2014-09-10 ENCOUNTER — Encounter: Payer: Self-pay | Admitting: Internal Medicine

## 2014-09-11 NOTE — Telephone Encounter (Signed)
Pt called back to see of the last OV note and clearance letter has been sent to his surgeon Dr. Netta Cedars? I do not see any documentation that this has been sent. Please advise. [place] Bing, CMA

## 2014-09-11 NOTE — Telephone Encounter (Signed)
We did send the letter to Dr Veverly Fells. Ok to send it again, with copy of latest ov note to Dr Esmond Plants.

## 2014-09-12 NOTE — Telephone Encounter (Signed)
Spoke with the pt  I have refaxed PSG, ov note and letter from [facility] to Dr Veverly Fells at [phone] Nothing further needed

## 2014-12-17 ENCOUNTER — Telehealth: Payer: Self-pay | Admitting: Medical

## 2014-12-17 ENCOUNTER — Telehealth: Payer: Self-pay | Admitting: Family

## 2014-12-17 ENCOUNTER — Ambulatory Visit (INDEPENDENT_AMBULATORY_CARE_PROVIDER_SITE_OTHER): Payer: BLUE CROSS/BLUE SHIELD | Admitting: Medical

## 2014-12-17 ENCOUNTER — Ambulatory Visit (HOSPITAL_BASED_OUTPATIENT_CLINIC_OR_DEPARTMENT_OTHER)
Admission: RE | Admit: 2014-12-17 | Discharge: 2014-12-17 | Disposition: A | Discharge status: Home / Self Care | Payer: BLUE CROSS/BLUE SHIELD | Source: Ambulatory Visit | Attending: Medical | Admitting: Medical

## 2014-12-17 ENCOUNTER — Encounter: Payer: Self-pay | Admitting: Medical

## 2014-12-17 VITALS — BP 127/78 | HR 65 | Temp 98.4°F | Ht 70.0 in | Wt 273.6 lb

## 2014-12-17 DIAGNOSIS — R918 Other nonspecific abnormal finding of lung field: Secondary | ICD-10-CM | POA: Insufficient documentation

## 2014-12-17 DIAGNOSIS — J209 Acute bronchitis, unspecified: Secondary | ICD-10-CM

## 2014-12-17 DIAGNOSIS — R0989 Other specified symptoms and signs involving the circulatory and respiratory systems: Secondary | ICD-10-CM | POA: Diagnosis not present

## 2014-12-17 DIAGNOSIS — R911 Solitary pulmonary nodule: Secondary | ICD-10-CM

## 2014-12-17 DIAGNOSIS — R05 Cough: Secondary | ICD-10-CM | POA: Insufficient documentation

## 2014-12-17 MED ORDER — HYDROCODONE-HOMATROPINE 5-1.5 MG/5ML PO SYRP: 5.0000 mL | ORAL_SOLUTION | Freq: Three times a day (TID) | ORAL | Status: DC | PRN

## 2014-12-17 MED ORDER — CEFTRIAXONE SODIUM 1 G IJ SOLR
1.0000 g | Freq: Once | INTRAMUSCULAR | Status: AC
  Administered 2014-12-17: 1 g via INTRAMUSCULAR

## 2014-12-17 MED ORDER — AZITHROMYCIN 250 MG PO TABS: ORAL_TABLET | ORAL | Status: DC

## 2014-12-17 MED ORDER — FLUTICASONE PROPIONATE 50 MCG/ACT NA SUSP: 2.0000 | Freq: Every day | NASAL | Status: DC

## 2014-12-17 NOTE — Addendum Note (Signed)
Addended by: Anabel Halon on: 12/17/2014 09:06 AM   Modules accepted: Miquel Dunn

## 2014-12-17 NOTE — Progress Notes (Signed)
Pre visit review using our clinic review tool, if applicable. No additional management support is needed unless otherwise documented below in the visit note. 

## 2014-12-17 NOTE — Patient Instructions (Addendum)
Acute bronchitis You appear to have bronchitis with possible sinusitis as well. Rest hydrate and tylenol for fever. I am prescribing cough medicine hydromet, and  Azithromycin antibiotic. For your nasal congestion you could use  nasal steroid flonase.  Recommend a chest xray(evaluate for walking pneumonia)  Follow up in 7-10 days or as needed  Regarding your severe nasal congestion and cpap issues. One way to decongest you more aggressively would be steroid injection. You declined today. Congestion should gradually resolve if persists let us know.     Also gave rocephin 1 gram in office.

## 2014-12-17 NOTE — Telephone Encounter (Signed)
Spoke with radiology and they state report has already been called in to [place].

## 2014-12-17 NOTE — Assessment & Plan Note (Signed)
You appear to have bronchitis with possible sinusitis as well. Rest hydrate and tylenol for fever. I am prescribing cough medicine hydromet, and  Azithromycin antibiotic. For your nasal congestion you could use  nasal steroid flonase.  Recommend a chest xray(evaluate for walking pneumonia)  Follow up in 7-10 days or as needed  Regarding your severe nasal congestion and cpap issues. One way to decongest you more aggressively would be steroid injection. You declined today. Congestion should gradually resolve if persists let us know.

## 2014-12-17 NOTE — Progress Notes (Signed)
   Subjective:    Patient ID: [name], male    DOB: September 28, 1951, 64 y.o.   MRN: [id number]  HPI   Pt in with cough for 5 days. This is persistent. He describes yellowish mucous. Nasal, sinus congestion, and chest congestion. Pt had fever low grades. Some mild sweating at night.  Never smoked. No history of asthma. No hx of chronic bronchits. One time had pneumonia years ago.    Review of Systems  Constitutional: Positive for fever and fatigue. Negative for chills.  HENT: Positive for congestion and sinus pressure. Negative for sore throat.   Respiratory: Positive for cough. Negative for chest tightness, shortness of breath and wheezing.        Productive cough  Cardiovascular: Negative for chest pain and palpitations.  Gastrointestinal: Negative for abdominal pain.  Musculoskeletal: Negative for back pain, neck pain and neck stiffness.  Neurological: Negative for dizziness, syncope, weakness, numbness and headaches.  Hematological: Negative for adenopathy. Does not bruise/bleed easily.       Objective:   Physical Exam   General  Mental Status - Alert. General Appearance - Well groomed. Not in acute distress.  Skin Rashes- No Rashes.  HEENT Head- Normal. Ear Auditory Canal - Left- Normal. Right - Normal.Tympanic Membrane- Left- Normal. Right- Normal. Eye Sclera/Conjunctiva- Left- Normal. Right- Normal. Nose & Sinuses Nasal Mucosa- Left-  Boggy + Congested. Right-  Boggy + Congested. No sinus pressure. Mouth & Throat Lips: Upper Lip- Normal: no dryness, cracking, pallor, cyanosis, or vesicular eruption. Lower Lip-Normal: no dryness, cracking, pallor, cyanosis or vesicular eruption. Buccal Mucosa- Bilateral- No Aphthous ulcers. Oropharynx- No Discharge or Erythema. Tonsils: Characteristics- Bilateral- No Erythema or Congestion. Size/Enlargement- Bilateral- No enlargement. Discharge- bilateral-None.  Neck Neck- Supple. No Masses.   Chest and Lung  Exam Auscultation: Breath Sounds:- even and unlabored, but bilateral upper lobe rhonchi.  Cardiovascular Auscultation:Rythm- Regular, rate and rhythm. Murmurs & Other Heart Sounds:Ausculatation of the heart reveal- No Murmurs.  Lymphatic Head & Neck General Head & Neck Lymphatics: Bilateral: Description- No Localized lymphadenopathy.          Assessment & Plan:

## 2014-12-17 NOTE — Telephone Encounter (Signed)
Caller name: Opal Sidles from [facility]    Call back number: [phone]   Reason for call:  Calling in chest x-ray results

## 2014-12-17 NOTE — Telephone Encounter (Signed)
Ct of chest order for nodular region.

## 2014-12-17 NOTE — Addendum Note (Signed)
Addended by: Bunnie Domino on: 12/17/2014 09:20 AM   Modules accepted: Orders

## 2014-12-19 ENCOUNTER — Ambulatory Visit (HOSPITAL_BASED_OUTPATIENT_CLINIC_OR_DEPARTMENT_OTHER): Payer: BLUE CROSS/BLUE SHIELD

## 2014-12-19 ENCOUNTER — Ambulatory Visit (HOSPITAL_BASED_OUTPATIENT_CLINIC_OR_DEPARTMENT_OTHER)
Admission: RE | Admit: 2014-12-19 | Discharge: 2014-12-19 | Disposition: A | Discharge status: Home / Self Care | Payer: BLUE CROSS/BLUE SHIELD | Source: Ambulatory Visit | Attending: Medical | Admitting: Medical

## 2014-12-19 ENCOUNTER — Telehealth: Payer: Self-pay | Admitting: Medical

## 2014-12-19 DIAGNOSIS — R0989 Other specified symptoms and signs involving the circulatory and respiratory systems: Secondary | ICD-10-CM | POA: Insufficient documentation

## 2014-12-19 DIAGNOSIS — R05 Cough: Secondary | ICD-10-CM | POA: Diagnosis present

## 2014-12-19 DIAGNOSIS — R911 Solitary pulmonary nodule: Secondary | ICD-10-CM

## 2014-12-19 NOTE — Telephone Encounter (Signed)
Caller name: Greer Relation to pt: Call back number: [phone] Pharmacy:  Reason for call:  Please call with CT results.

## 2014-12-22 ENCOUNTER — Ambulatory Visit (INDEPENDENT_AMBULATORY_CARE_PROVIDER_SITE_OTHER): Payer: BLUE CROSS/BLUE SHIELD | Admitting: Family

## 2014-12-22 ENCOUNTER — Encounter: Payer: Self-pay | Admitting: Family

## 2014-12-22 VITALS — BP 132/78 | HR 62 | Temp 97.9°F | Resp 16 | Ht 70.0 in | Wt 274.8 lb

## 2014-12-22 DIAGNOSIS — R7989 Other specified abnormal findings of blood chemistry: Secondary | ICD-10-CM

## 2014-12-22 DIAGNOSIS — L918 Other hypertrophic disorders of the skin: Secondary | ICD-10-CM

## 2014-12-22 DIAGNOSIS — R9389 Abnormal findings on diagnostic imaging of other specified body structures: Secondary | ICD-10-CM | POA: Insufficient documentation

## 2014-12-22 DIAGNOSIS — L989 Disorder of the skin and subcutaneous tissue, unspecified: Secondary | ICD-10-CM

## 2014-12-22 DIAGNOSIS — R938 Abnormal findings on diagnostic imaging of other specified body structures: Secondary | ICD-10-CM

## 2014-12-22 DIAGNOSIS — I2510 Atherosclerotic heart disease of native coronary artery without angina pectoris: Secondary | ICD-10-CM

## 2014-12-22 LAB — LIPID PANEL
Cholesterol: 196 mg/dL (ref 0–200)
HDL: 37.8 mg/dL — ABNORMAL LOW (ref >39.00)
NonHDL: 158.2
TRIGLYCERIDES: 230 mg/dL — AB (ref 0.0–149.0)
Total CHOL/HDL Ratio: 5
VLDL: 46 mg/dL — AB (ref 0.0–40.0)

## 2014-12-22 LAB — LDL CHOLESTEROL, DIRECT: Direct LDL: 127 mg/dL

## 2014-12-22 NOTE — Progress Notes (Signed)
Pre visit review using our clinic review tool, if applicable. No additional management support is needed unless otherwise documented below in the visit note. 

## 2014-12-22 NOTE — Assessment & Plan Note (Signed)
Refer to derm in [place].

## 2014-12-22 NOTE — Progress Notes (Signed)
Subjective:    Patient ID: [name], male    DOB: [birth year], 64 y.o.   MRN: [id number]  HPI  Patient presents today with several concerns:  1) Chest CT- this was performed on 2/19 in the setting of acute bronchitis.  CT noted 84mm superior segment RLL subpleural nodule- felt very likely post inflammatory or benign. Pt is a non-smoker.  He is concerned about this finding. He is also concerned about finding of prominent coronary artery calcified atherosclerosis on CT chest. He would like referral to cardiology.  He denies chest or shortness of breath.  2) Obesity- reports that he has been working hard on diet.  He has lost 31 pounds in all.    3) Phlebitis, right knee injury- had surgery by Dr. Veverly Fells [place] [facility] (workmans comp 12/2)- reports that surgery went well and he is feeling better.   He is requesting referral back to his podiatrist, referral to dermatologist for skin tags and lesions on forehead (does not wish to return to previous dermatologist but he does not remember where he was seen and we do not have any consult notes in our records. He thinks this was in [place] however).    Denies back pain  Review of Systems See HPI  Past Medical History  Diagnosis Date  . Obesity   . OSA (obstructive sleep apnea)   . GERD (gastroesophageal reflux disease)   . Back pain     chronic    History   Social History  . Marital Status: Single    Spouse Name: N/A  . Number of Children: N/A  . Years of Education: N/A   Occupational History  . Not on file.   Social History Main Topics  . Smoking status: Never Smoker   . Smokeless tobacco: Never Used  . Alcohol Use: Yes  . Drug Use: No  . Sexual Activity: Not on file   Other Topics Concern  . Not on file   Social History Narrative    Past Surgical History  Procedure Laterality Date  . Hemangioma excision  1990    left leg    Family History  Problem Relation Age of Onset  . Arthritis Mother     spine  . Heart  disease Mother   . Hypertension Mother   . Cancer Mother     bone  . Heart disease Brother     murmur    Allergies  Allergen Reactions  . Other     Anesthetic "pentathol" caused hallucinations over 48 hrs    Current Outpatient Prescriptions on File Prior to Visit  Medication Sig Dispense Refill  . aspirin EC 81 MG tablet Take 81 mg by mouth daily.     No current facility-administered medications on file prior to visit.    BP 132/78 mmHg  Pulse 62  Temp(Src) 97.9 F (36.6 C) (Oral)  Resp 16  Ht 5\' 10"  (1.778 m)  Wt 274 lb 12.8 oz (124.648 kg)  BMI 39.43 kg/m2  SpO2 95%       Objective:    Physical Exam  Constitutional: He is oriented to person, place, and time. He appears well-developed and well-nourished. No distress.  HENT:  Head: Normocephalic and atraumatic.  Cardiovascular: Normal rate and regular rhythm.   No murmur heard. Pulmonary/Chest: Effort normal and breath sounds normal. No respiratory distress. He has no wheezes. He has no rales.  Musculoskeletal: He exhibits no edema.  Neurological: He is alert and oriented to person, place, and time.  Skin: Skin is warm and dry.  Benign appear lesions on forehead. (? Sebaceous gland hyperplasia)  Psychiatric: He has a normal mood and affect. His behavior is normal. Thought content normal.    BP 132/78 mmHg  Pulse 62  Temp(Src) 97.9 F (36.6 C) (Oral)  Resp 16  Ht 5\' 10"  (1.778 m)  Wt 274 lb 12.8 oz (124.648 kg)  BMI 39.43 kg/m2  SpO2 95% Wt Readings from Last 3 Encounters:  12/22/14 274 lb 12.8 oz (124.648 kg)  12/17/14 273 lb 9.6 oz (124.104 kg)  09/04/14 285 lb (129.275 kg)     Lab Results  Component Value Date   WBC 9.1 10/13/2012   HGB 14.9 10/13/2012   HCT 43.5 10/13/2012   PLT 257 10/13/2012   GLUCOSE 116* 10/13/2012   CHOL 181 05/02/2007   TRIG 151* 05/02/2007   HDL 31.5* 05/02/2007   LDLCALC 119* 05/02/2007   ALT 23 09/11/2013   AST 25 09/11/2013   NA 133* 10/13/2012   K 4.3  10/13/2012   CL 99 10/13/2012   CREATININE 0.96 10/13/2012   BUN 23 10/13/2012   CO2 22 10/13/2012   TSH 1.318 05/08/2009   PSA 0.46 05/02/2007    Ct Chest Wo Contrast  12/19/2014   CLINICAL DATA:  64 year old male with abnormal anterior cardiophrenic angle on chest radiograph performed for 5 day history of cough and chest congestion. Initial encounter.  EXAM: CT CHEST WITHOUT CONTRAST  TECHNIQUE: Multidetector CT imaging of the chest was performed following the standard protocol without IV contrast.  COMPARISON:  Chest radiographs 12/17/2014.  FINDINGS: Mild mediastinal lipomatosis, including prominent anterior cardiophrenic angle fat greater on the right. This accounts for the radiographic finding.  No pericardial or pleural effusion. Coronary artery calcified atherosclerosis is widespread. Minimal aortic calcified plaque. No mediastinal or hilar lymphadenopathy. Negative non contrast thoracic inlet. No axillary lymphadenopathy.  Major airways are patent. There is curvilinear scarring or atelectasis in the right lower lobe. No consolidation or pulmonary opacity suspicious for pneumonia. No pleural effusion. There is a 4 mm superior segment right lower lobe subpleural nodule best seen on coronal image 95.  Negative visualized non contrast liver, gallbladder, spleen, pancreas, adrenal glands, and bowel in the upper abdomen. No acute osseous abnormality identified.  IMPRESSION: 1. 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 2. No pneumonia identified. Right lower lobe atelectasis. Small 4 mm superior segment right lower lobe subpleural nodule, very likely postinflammatory and benign. If the patient is at high risk for bronchogenic carcinoma, follow-up chest CT at 1 year is recommended. If the patient is at low risk, no follow-up is needed. This recommendation follows the consensus statement: Guidelines for Management of Small Pulmonary Nodules Detected on CT  Scans: A Statement from the [employer] as published in Radiology 2005; 237:395-400. 3. Prominent coronary artery calcified atherosclerosis.   Electronically Signed   By: Genevie Ann M.D.   On: 12/19/2014 13:08       Assessment & Plan:   Problem List Items Addressed This Visit    None       Nance Pear., NP

## 2014-12-22 NOTE — Telephone Encounter (Signed)
I called pt today. [name] actually informed pt of study and will follow up with him. She is his pcp.

## 2014-12-22 NOTE — Patient Instructions (Addendum)
You will be contacted about your referral to dermatology an cardiology. Please contact Dr. Mellody Drown office to arrange follow up [phone] Continue to work on healthy diet, exercise, weight loss.  Please complete lab work prior to leaving.

## 2014-12-22 NOTE — Assessment & Plan Note (Signed)
Lung nodule- no follow up needed since pt is non-smoker, however pt still wishes to repeat CT in 1 year. Will refer to cardiology for risk stratification at pt request due to finding of coronary atherosclerosis.  We did discuss continued weight loss, continue aspirin, low fat diet, obtain lipid panel. I did recommend that pt consider statin if lipids above goal, however he is resistant to statin therapy and would like to discuss with cardiology.  Obtain FLP.

## 2014-12-23 ENCOUNTER — Encounter: Payer: Self-pay | Admitting: Family

## 2014-12-23 DIAGNOSIS — E781 Pure hyperglyceridemia: Secondary | ICD-10-CM | POA: Insufficient documentation

## 2014-12-25 ENCOUNTER — Encounter: Payer: Self-pay | Admitting: Family

## 2014-12-29 ENCOUNTER — Encounter: Payer: Self-pay | Admitting: Family

## 2015-02-11 ENCOUNTER — Ambulatory Visit: Payer: Self-pay | Admitting: Cardiology

## 2015-12-22 IMAGING — CT CT CHEST W/O CM
2 of 3 series · 15 of 36 positions shown, 18 images · non-contrast
Comparison: Chest radiographs 12/17/2014.

CLINICAL DATA: 63-year-old male with abnormal anterior
cardiophrenic angle on chest radiograph performed for 5 day history
of cough and chest congestion. Initial encounter.

EXAM:
CT CHEST WITHOUT CONTRAST
TECHNIQUE: Multidetector CT imaging of the chest was performed following the
standard protocol without IV contrast..

[Series 2: chest 5.0 b31f · axial · 0.76mm/px · z∈[+903,+1168]mm · 12 of 63 slices shown, 15 images]
[im 5/63  mediastinal]
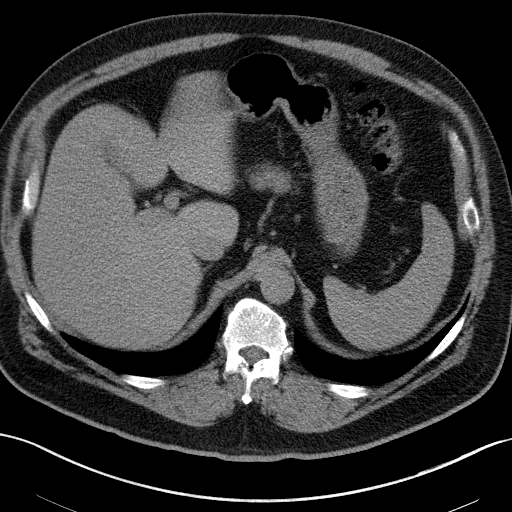
[im 5/63  lung]
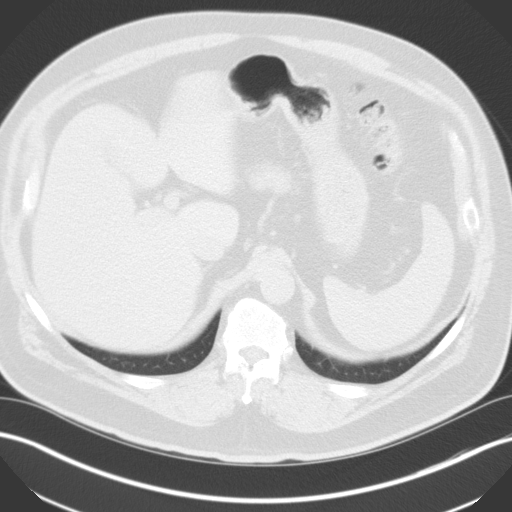
[im 10/63  lung]
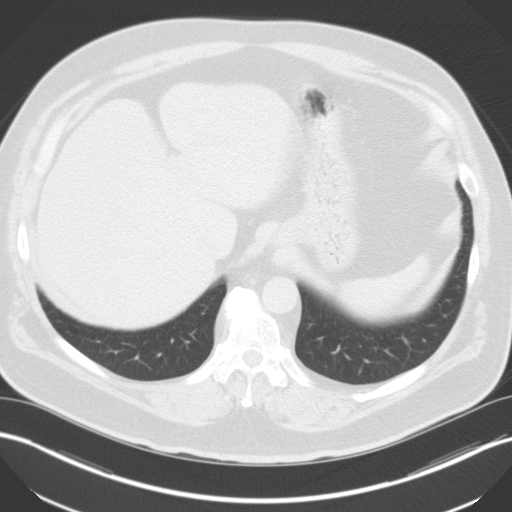
[im 14/63  lung]
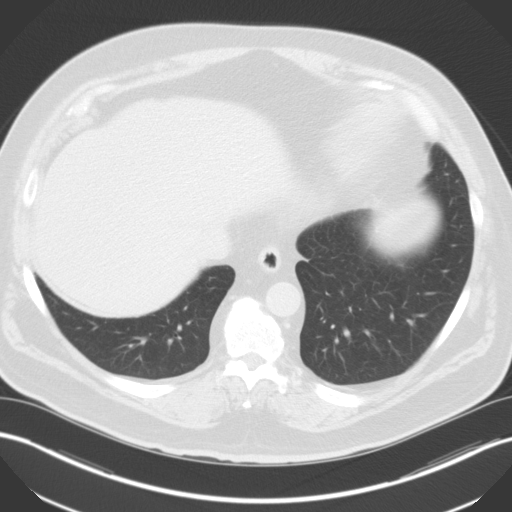
[im 19/63  lung]
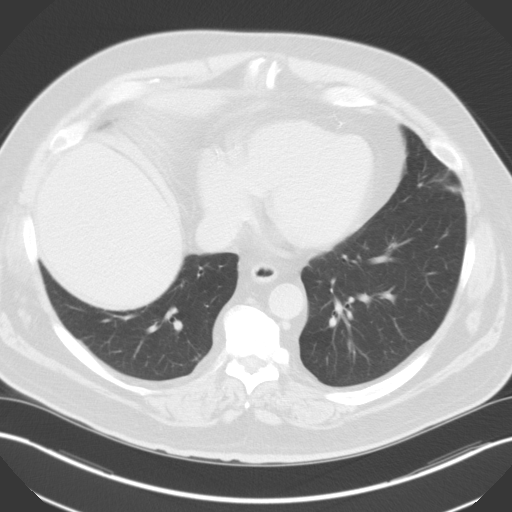
[im 23/63  mediastinal]
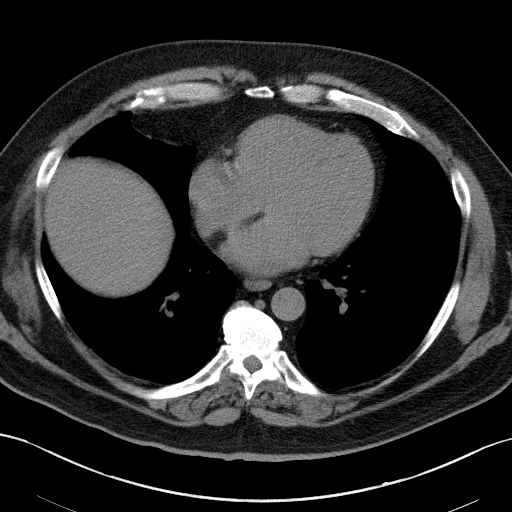
[im 23/63  lung]
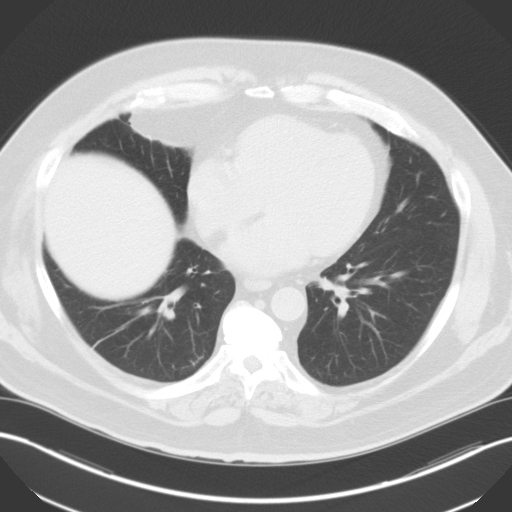
[im 28/63  lung]
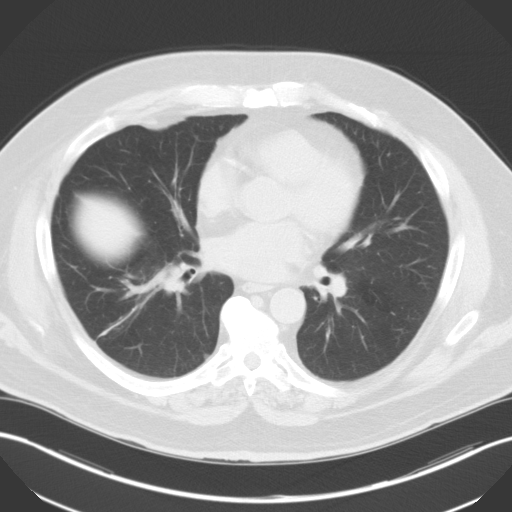
[im 35/63  lung]
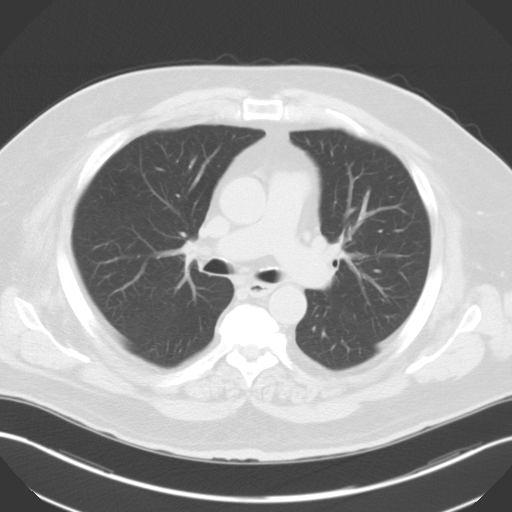
[im 40/63  lung]
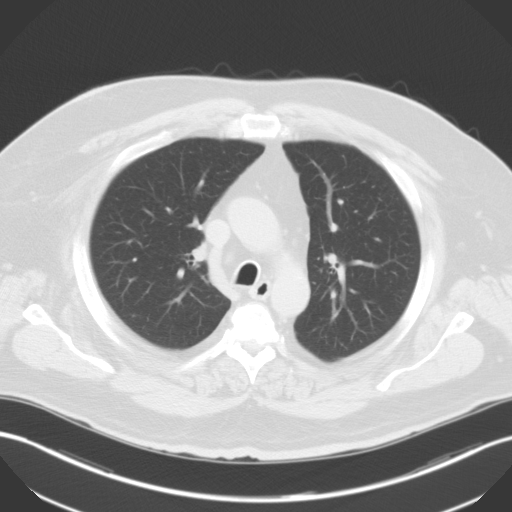
[im 44/63  mediastinal]
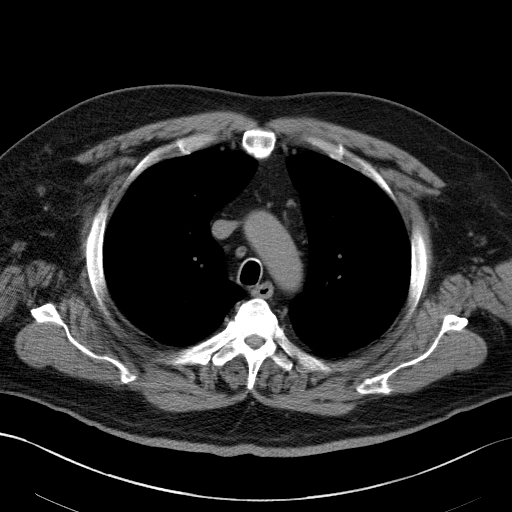
[im 44/63  lung]
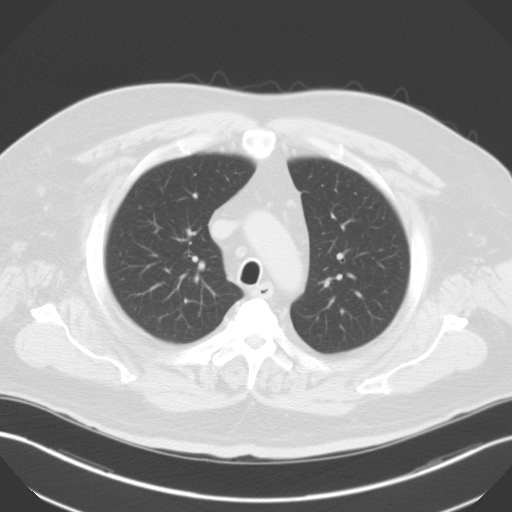
[im 49/63  lung]
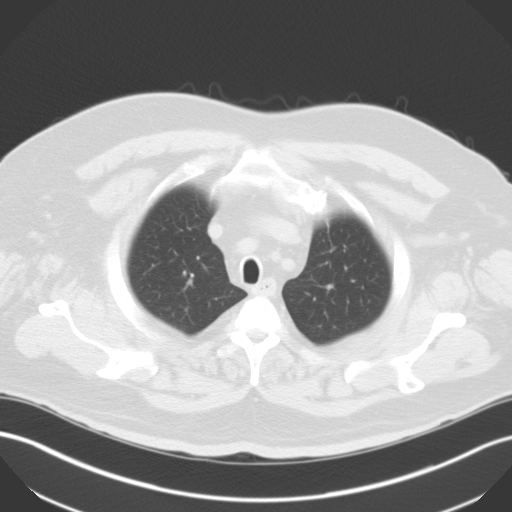
[im 53/63  lung]
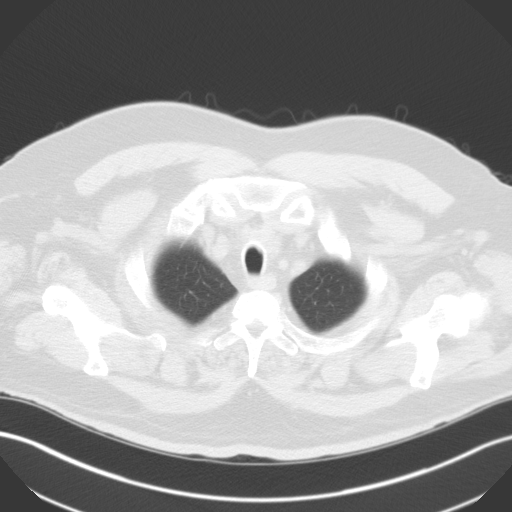
[im 58/63  lung]
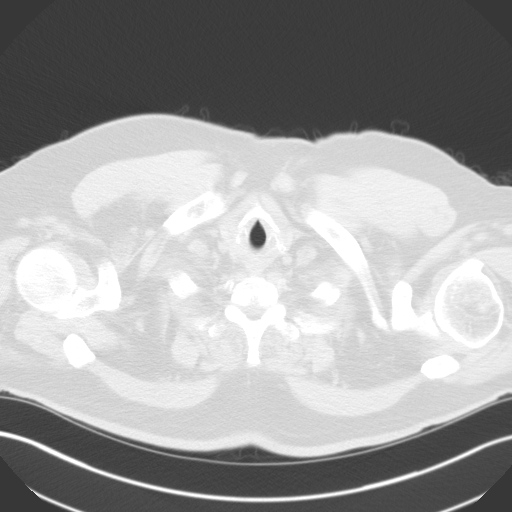

[Series 6: chest 3.0 coronal · coronal · 0.64mm/px · 3 of 110 slices shown]
[im 22/110  lung]
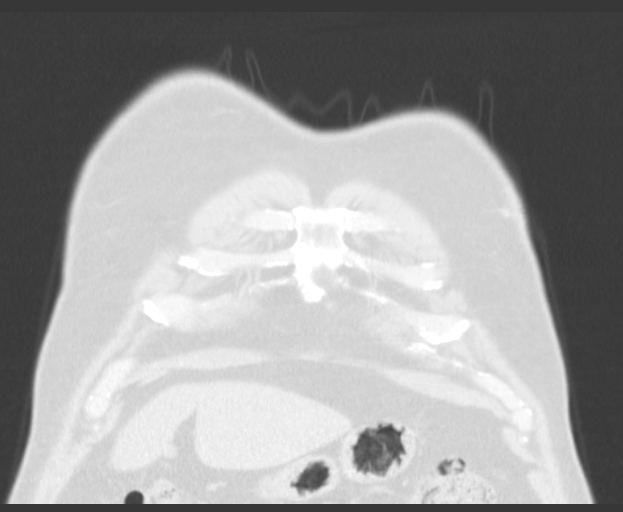
[im 44/110  lung]
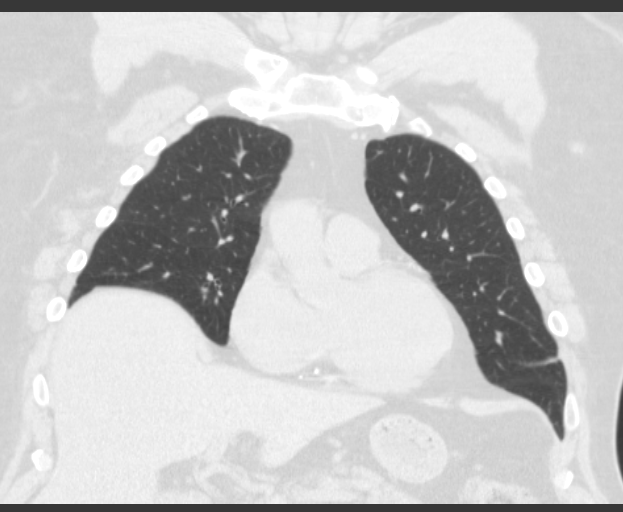
[im 66/110  lung]
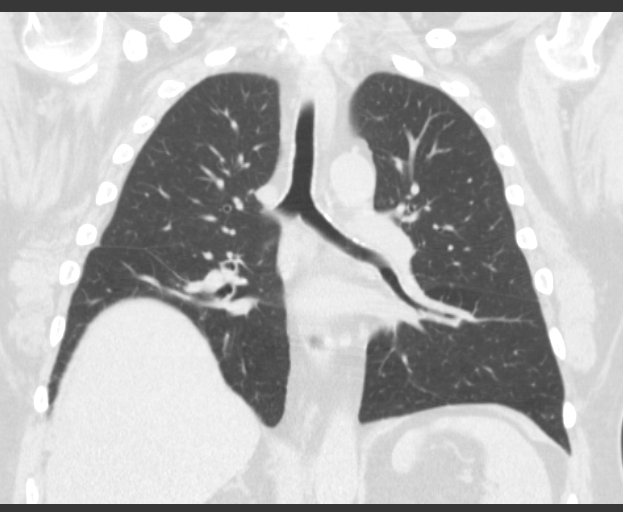

[15 of 36 positions shown; findings below may reference images not displayed]

FINDINGS: Mild mediastinal lipomatosis, including prominent anterior
cardiophrenic angle fat greater on the right. This accounts for the
radiographic finding.

No pericardial or pleural effusion. Coronary artery calcified
atherosclerosis is widespread. Minimal aortic calcified plaque. No
mediastinal or hilar lymphadenopathy. Negative non contrast thoracic
inlet. No axillary lymphadenopathy.

Major airways are patent. There is curvilinear scarring or
atelectasis in the right lower lobe. No consolidation or pulmonary
opacity suspicious for pneumonia. No pleural effusion. There is a 4
mm superior segment right lower lobe subpleural nodule best seen on
coronal image 95.

Negative visualized non contrast liver, gallbladder, spleen,
pancreas, adrenal glands, and bowel in the upper abdomen. No acute
osseous abnormality identified.
IMPRESSION: 1. Benign mediastinal lipomatosis was responsible for the earlier
radiographic appearance.
2. No pneumonia identified. Right lower lobe atelectasis. Small 4 mm
superior segment right lower lobe subpleural nodule, very likely
postinflammatory and benign. If the patient is at high risk for
bronchogenic carcinoma, follow-up chest CT at 1 year is recommended.
If the patient is at low risk, no follow-up is needed. This
recommendation follows the consensus statement: Guidelines for
Management of Small Pulmonary Nodules Detected on CT Scans: A
Statement from the [HOSPITAL] as published in Radiology
9118; [DATE].
3. Prominent coronary artery calcified atherosclerosis.

## 2016-02-18 ENCOUNTER — Ambulatory Visit (INDEPENDENT_AMBULATORY_CARE_PROVIDER_SITE_OTHER): Payer: BLUE CROSS/BLUE SHIELD | Admitting: Family Medicine

## 2016-02-18 ENCOUNTER — Encounter: Payer: Self-pay | Admitting: Family Medicine

## 2016-02-18 VITALS — BP 130/82 | HR 97 | Temp 98.1°F | Ht 70.0 in | Wt 297.2 lb

## 2016-02-18 DIAGNOSIS — R0981 Nasal congestion: Secondary | ICD-10-CM

## 2016-02-18 DIAGNOSIS — J0110 Acute frontal sinusitis, unspecified: Secondary | ICD-10-CM | POA: Diagnosis not present

## 2016-02-18 MED ORDER — AMOXICILLIN 500 MG PO CAPS: 1000.0000 mg | ORAL_CAPSULE | Freq: Two times a day (BID) | ORAL | Status: DC

## 2016-02-18 NOTE — Progress Notes (Signed)
[facility] [address], [place], [place] [address] [phone]  Date:  02/18/2016   Name:  [name]   DOB:  06/13/1951   MRN:  [id number]  PCP:  Nance Pear., NP    Chief Complaint: Nasal Congestion   History of Present Illness:  [name] is a 65 y.o. very pleasant male patient who presents with the following:  Here today with complaint of illness.  He has noted heavy sinus congestion for about 2 weeks. Did have a ST but this is now resolved.  He does use a CPAP for his OSA His ears are clogged feeling and popping.  The ears are not painful but he does feel like he needs to pop them a lot He will have a lot of phlegm in the back of his throat in the am.    Also, he has noted mucus all in his nose in the morning- this is stiff and hard to get out.  The mucus is a deep yellow He has dry cough- however this is a secondary symptoms He has not noted any fever. No body aches or fever No GI symptoms  Tolerates the CPAP well  Patient Active Problem List   Diagnosis Date Noted  . Hypertriglyceridemia 12/23/2014  . Abnormal finding on CT scan 12/22/2014  . Skin lesion of face 12/22/2014  . Acute bronchitis 12/17/2014  . Phlebitis and thrombophlebitis 02/25/2014  . Ingrown toenail 02/25/2014  . Back pain, thoracic 10/12/2013  . Plantar wart of left foot 09/12/2013  . Epistaxis 12/19/2012  . Onychomycosis 12/19/2012  . SHOULDER PAIN, LEFT, CHRONIC 06/15/2010  . MORBID OBESITY 08/31/2009  . INTERMITTENT VERTIGO 08/28/2009  . PALPITATIONS 05/08/2009  . Obstructive sleep apnea 08/09/2007  . GERD 08/09/2007    Past Medical History  Diagnosis Date  . Obesity   . OSA (obstructive sleep apnea)   . GERD (gastroesophageal reflux disease)   . Back pain     chronic    Past Surgical History  Procedure Laterality Date  . Hemangioma excision  1990    left leg    Social History  Substance Use  Topics  . Smoking status: Never Smoker   . Smokeless tobacco: Never Used  . Alcohol Use: Yes    Family History  Problem Relation Age of Onset  . Arthritis Mother     spine  . Heart disease Mother   . Hypertension Mother   . Cancer Mother     bone  . Heart disease Brother     murmur    Allergies  Allergen Reactions  . Other     Anesthetic "pentathol" caused hallucinations over 48 hrs    Medication list has been reviewed and updated.  Current Outpatient Prescriptions on File Prior to Visit  Medication Sig Dispense Refill  . aspirin EC 81 MG tablet Take 81 mg by mouth daily. Reported on 02/18/2016     No current facility-administered medications on file prior to visit.    Review of Systems:  As per HPI- otherwise negative.   Physical Examination: Filed Vitals:   02/18/16 1642  BP: 130/82  Pulse: 97  Temp: 98.1 F (36.7 C)   Filed Vitals:   02/18/16 1642  Height: 5\' 10"  (1.778 m)  Weight: 297 lb 3.2 oz (134.809 kg)   Body mass index is 42.64 kg/(m^2). Ideal Body Weight: Weight in (lb) to have BMI = 25: 173.9  GEN: WDWN, NAD,  Non-toxic, A & O x 3, obese, looks well HEENT: Atraumatic, Normocephalic. Neck supple. No masses, No LAD.  Bilateral TM wnl, oropharynx normal.  PEERL,EOMI.   Nasal cavity with discharge and erythema  Ears and Nose: No external deformity. CV: RRR, No M/G/R. No JVD. No thrill. No extra heart sounds. PULM: CTA B, no wheezes, crackles, rhonchi. No retractions. No resp. distress. No accessory muscle use. EXTR: No c/c/e NEURO Normal gait.  PSYCH: Normally interactive. Conversant. Not depressed or anxious appearing.  Calm demeanor.    Assessment and Plan: Acute frontal sinusitis, recurrence not specified - Plan: amoxicillin (AMOXIL) 500 MG capsule  Nasal congestion  Treat for a sinus infection with amoxicillin He will let me know if not feeling better soon- Sooner if worse.   Use the amoxicillin for your sinus infection  If you are  not already, adding some guaifenesin to your regimen (found in mucinex) can help to thin and loosen mucus  A layer of vaseline in your nose will help to prevent dryness and crusting  Let me know if you are not feeing better soon!            Signed Lamar Blinks, MD

## 2016-02-18 NOTE — Progress Notes (Signed)
Pre visit review using our clinic tool,if applicable. No additional management support is needed unless otherwise documented below in the visit note.  

## 2016-02-18 NOTE — Patient Instructions (Signed)
Use the amoxicillin for your sinus infection If you are not already, adding some guaifenesin to your regimen (found in mucinex) can help to thin and loosen mucus A layer of vaseline in your nose will help to prevent dryness and crusting Let me know if you are not feeing better soon!

## 2016-05-05 DIAGNOSIS — H524 Presbyopia: Secondary | ICD-10-CM | POA: Diagnosis not present

## 2016-05-09 ENCOUNTER — Encounter: Payer: Self-pay | Admitting: Family

## 2016-05-09 ENCOUNTER — Ambulatory Visit (INDEPENDENT_AMBULATORY_CARE_PROVIDER_SITE_OTHER): Payer: BLUE CROSS/BLUE SHIELD | Admitting: Family

## 2016-05-09 VITALS — BP 120/80 | HR 72 | Temp 98.2°F | Ht 70.0 in | Wt 294.2 lb

## 2016-05-09 DIAGNOSIS — B351 Tinea unguium: Secondary | ICD-10-CM | POA: Diagnosis not present

## 2016-05-09 DIAGNOSIS — Z808 Family history of malignant neoplasm of other organs or systems: Secondary | ICD-10-CM

## 2016-05-09 NOTE — Progress Notes (Signed)
   Subjective:    Patient ID: [name], male    DOB: 1951/02/05, 65 y.o.   MRN: [id number]  HPI  [name] is a 65 yr old male who presents today to discuss nail problem.  Pt notes that he has had thickening of some of his finger and toenails for some time.  He is interested in learning about his treatment options.   He has a family history of skin cancer and is requesting a referral to dermatology.   Review of Systems    see HPI  Past Medical History  Diagnosis Date  . Obesity   . OSA (obstructive sleep apnea)   . GERD (gastroesophageal reflux disease)   . Back pain     chronic     Social History   Social History  . Marital Status: Single    Spouse Name: N/A  . Number of Children: N/A  . Years of Education: N/A   Occupational History  . Not on file.   Social History Main Topics  . Smoking status: Never Smoker   . Smokeless tobacco: Never Used  . Alcohol Use: Yes  . Drug Use: No  . Sexual Activity: Not on file   Other Topics Concern  . Not on file   Social History Narrative    Past Surgical History  Procedure Laterality Date  . Hemangioma excision  1990    left leg    Family History  Problem Relation Age of Onset  . Arthritis Mother     spine  . Heart disease Mother   . Hypertension Mother   . Cancer Mother     bone  . Heart disease Brother     murmur    Allergies  Allergen Reactions  . Other     Anesthetic "pentathol" caused hallucinations over 48 hrs    Current Outpatient Prescriptions on File Prior to Visit  Medication Sig Dispense Refill  . aspirin EC 81 MG tablet Take 81 mg by mouth daily. Reported on 02/18/2016     No current facility-administered medications on file prior to visit.    BP 120/80 mmHg  Pulse 72  Temp(Src) 98.2 F (36.8 C) (Oral)  Ht 5\' 10"  (1.778 m)  Wt 294 lb 3.2 oz (133.448 kg)  BMI 42.21 kg/m2  SpO2 97%    Objective:   Physical Exam  Constitutional: He is oriented to person, place, and time. He  appears well-developed and well-nourished. No distress.  HENT:  Head: Normocephalic and atraumatic.  Musculoskeletal: He exhibits no edema.  Neurological: He is alert and oriented to person, place, and time.  Skin: Skin is warm and dry.  Hypertrophic toenails noted, some discoloration/hypertrophy of fingernails on right hand as well.    Psychiatric: He has a normal mood and affect. His behavior is normal. Thought content normal.          Assessment & Plan:  Onychomycosis- discussed topical rx (costly and ineffective) and oral rx with lamisil- much more effective but requires 12 weeks and monitoring of lft.  Pt wishes to defer treatment at this time and will think about it.  Family hx of melanoma- will refer to dermatology for skin exam.

## 2016-05-09 NOTE — Patient Instructions (Signed)
You will be contacted about your referral to dermatology.

## 2016-05-09 NOTE — Progress Notes (Signed)
Pre visit review using our clinic review tool, if applicable. No additional management support is needed unless otherwise documented below in the visit note. 

## 2016-05-10 ENCOUNTER — Encounter: Payer: Self-pay | Admitting: Family

## 2016-05-23 DIAGNOSIS — G4733 Obstructive sleep apnea (adult) (pediatric): Secondary | ICD-10-CM | POA: Diagnosis not present

## 2016-05-23 DIAGNOSIS — M7989 Other specified soft tissue disorders: Secondary | ICD-10-CM | POA: Diagnosis not present

## 2016-05-23 DIAGNOSIS — I8001 Phlebitis and thrombophlebitis of superficial vessels of right lower extremity: Secondary | ICD-10-CM | POA: Diagnosis not present

## 2016-09-05 ENCOUNTER — Encounter: Payer: Self-pay | Admitting: Physician Assistant

## 2016-09-05 ENCOUNTER — Ambulatory Visit (INDEPENDENT_AMBULATORY_CARE_PROVIDER_SITE_OTHER): Payer: BLUE CROSS/BLUE SHIELD | Admitting: Physician Assistant

## 2016-09-05 VITALS — BP 136/88 | HR 83 | Temp 98.0°F | Resp 18 | Ht 70.0 in | Wt 290.5 lb

## 2016-09-05 DIAGNOSIS — J019 Acute sinusitis, unspecified: Secondary | ICD-10-CM

## 2016-09-05 DIAGNOSIS — B9689 Other specified bacterial agents as the cause of diseases classified elsewhere: Secondary | ICD-10-CM | POA: Diagnosis not present

## 2016-09-05 MED ORDER — DOXYCYCLINE HYCLATE 100 MG PO CAPS: 100.0000 mg | ORAL_CAPSULE | Freq: Two times a day (BID) | ORAL | 0 refills | Status: DC

## 2016-09-05 NOTE — Patient Instructions (Signed)
Please take antibiotic as directed.  Increase fluid intake.  Use Saline nasal spray.  Take a daily multivitamin. Continue Coricidin as directed.  Place a humidifier in the bedroom.  Please call or return clinic if symptoms are not improving.  Sinusitis Sinusitis is redness, soreness, and swelling (inflammation) of the paranasal sinuses. Paranasal sinuses are air pockets within the bones of your face (beneath the eyes, the middle of the forehead, or above the eyes). In healthy paranasal sinuses, mucus is able to drain out, and air is able to circulate through them by way of your nose. However, when your paranasal sinuses are inflamed, mucus and air can become trapped. This can allow bacteria and other germs to grow and cause infection. Sinusitis can develop quickly and last only a short time (acute) or continue over a long period (chronic). Sinusitis that lasts for more than 12 weeks is considered chronic.  CAUSES  Causes of sinusitis include:  Allergies.  Structural abnormalities, such as displacement of the cartilage that separates your nostrils (deviated septum), which can decrease the air flow through your nose and sinuses and affect sinus drainage.  Functional abnormalities, such as when the small hairs (cilia) that line your sinuses and help remove mucus do not work properly or are not present. SYMPTOMS  Symptoms of acute and chronic sinusitis are the same. The primary symptoms are pain and pressure around the affected sinuses. Other symptoms include:  Upper toothache.  Earache.  Headache.  Bad breath.  Decreased sense of smell and taste.  A cough, which worsens when you are lying flat.  Fatigue.  Fever.  Thick drainage from your nose, which often is green and may contain pus (purulent).  Swelling and warmth over the affected sinuses. DIAGNOSIS  Your caregiver will perform a physical exam. During the exam, your caregiver may:  Look in your nose for signs of abnormal growths  in your nostrils (nasal polyps).  Tap over the affected sinus to check for signs of infection.  View the inside of your sinuses (endoscopy) with a special imaging device with a light attached (endoscope), which is inserted into your sinuses. If your caregiver suspects that you have chronic sinusitis, one or more of the following tests may be recommended:  Allergy tests.  Nasal culture A sample of mucus is taken from your nose and sent to a lab and screened for bacteria.  Nasal cytology A sample of mucus is taken from your nose and examined by your caregiver to determine if your sinusitis is related to an allergy. TREATMENT  Most cases of acute sinusitis are related to a viral infection and will resolve on their own within 10 days. Sometimes medicines are prescribed to help relieve symptoms (pain medicine, decongestants, nasal steroid sprays, or saline sprays).  However, for sinusitis related to a bacterial infection, your caregiver will prescribe antibiotic medicines. These are medicines that will help kill the bacteria causing the infection.  Rarely, sinusitis is caused by a fungal infection. In theses cases, your caregiver will prescribe antifungal medicine. For some cases of chronic sinusitis, surgery is needed. Generally, these are cases in which sinusitis recurs more than 3 times per year, despite other treatments. HOME CARE INSTRUCTIONS   Drink plenty of water. Water helps thin the mucus so your sinuses can drain more easily.  Use a humidifier.  Inhale steam 3 to 4 times a day (for example, sit in the bathroom with the shower running).  Apply a warm, moist washcloth to your face 3 to  4 times a day, or as directed by your caregiver.  Use saline nasal sprays to help moisten and clean your sinuses.  Take over-the-counter or prescription medicines for pain, discomfort, or fever only as directed by your caregiver. SEEK IMMEDIATE MEDICAL CARE IF:  You have increasing pain or severe  headaches.  You have nausea, vomiting, or drowsiness.  You have swelling around your face.  You have vision problems.  You have a stiff neck.  You have difficulty breathing. MAKE SURE YOU:   Understand these instructions.  Will watch your condition.  Will get help right away if you are not doing well or get worse. Document Released: 10/17/2005 Document Revised: 01/09/2012 Document Reviewed: 11/01/2011 [facility] Patient Information 2014 [place], [place].

## 2016-09-05 NOTE — Progress Notes (Signed)
   Patient presents to clinic today c/o 2 weeks of sinus pressure, sinus pain, L ear pain. Endorses now with some chest congestion and cough 2/2 PND. Denies objective fevers but notes feeling somewhat feverish this weekend. Denies chest pain or SOB. Denies sick contact. Is taking Coricidin HBP for symptom relief.   Past Medical History:  Diagnosis Date  . Back pain    chronic  . GERD (gastroesophageal reflux disease)   . Obesity   . OSA (obstructive sleep apnea)     Current Outpatient Prescriptions on File Prior to Visit  Medication Sig Dispense Refill  . aspirin EC 81 MG tablet Take 81 mg by mouth daily. Reported on 02/18/2016     No current facility-administered medications on file prior to visit.     Allergies  Allergen Reactions  . Other     Anesthetic "pentathol" caused hallucinations over 48 hrs    Family History  Problem Relation Age of Onset  . Arthritis Mother     spine  . Heart disease Mother   . Hypertension Mother   . Cancer Mother     bone  . Heart disease Brother     murmur    Social History   Social History  . Marital status: Single    Spouse name: N/A  . Number of children: N/A  . Years of education: N/A   Social History Main Topics  . Smoking status: Never Smoker  . Smokeless tobacco: Never Used  . Alcohol use Yes  . Drug use: No  . Sexual activity: Not on file   Other Topics Concern  . Not on file   Social History Narrative  . No narrative on file    Review of Systems - See HPI.  All other ROS are negative.  There were no vitals taken for this visit.  Physical Exam  Constitutional: He is oriented to person, place, and time and well-developed, well-nourished, and in no distress.  HENT:  Head: Normocephalic and atraumatic.  Right Ear: External ear normal.  Left Ear: External ear normal.  Nose: Mucosal edema and rhinorrhea present.  Mouth/Throat: Oropharynx is clear and moist. No oropharyngeal exudate.  +TTP sinuses. PND noted in  posterior oropharynx.  Eyes: Conjunctivae are normal.  Neck: Neck supple.  Cardiovascular: Normal rate, regular rhythm, normal heart sounds and intact distal pulses.   Pulmonary/Chest: Effort normal and breath sounds normal. No respiratory distress. He has no wheezes. He has no rales. He exhibits no tenderness.  Neurological: He is alert and oriented to person, place, and time.  Skin: Skin is warm and dry. No rash noted.  Psychiatric: Affect normal.  Vitals reviewed.  Assessment/Plan: 1. Acute bacterial sinusitis Rx doxycycline.  Increase fluids.  Rest.  Saline nasal spray.  Probiotic.   Humidifier in bedroom. Continue Corcidin.  Call or return to clinic if symptoms are not improving.  - doxycycline (VIBRAMYCIN) 100 MG capsule; Take 1 capsule (100 mg total) by mouth 2 (two) times daily.  Dispense: 20 capsule; Refill: 0   Leeanne Rio, [place]

## 2016-09-05 NOTE — Progress Notes (Signed)
Pre visit review using our clinic review tool, if applicable. No additional management support is needed unless otherwise documented below in the visit note/SLS  

## 2016-11-14 ENCOUNTER — Emergency Department (HOSPITAL_COMMUNITY)
Admission: EM | Admit: 2016-11-14 | Discharge: 2016-11-14 | Disposition: A | Discharge status: Home / Self Care | Payer: BLUE CROSS/BLUE SHIELD | Attending: Emergency Medicine | Admitting: Emergency Medicine

## 2016-11-14 ENCOUNTER — Encounter (HOSPITAL_COMMUNITY): Payer: Self-pay

## 2016-11-14 DIAGNOSIS — Z7982 Long term (current) use of aspirin: Secondary | ICD-10-CM | POA: Insufficient documentation

## 2016-11-14 DIAGNOSIS — W461XXA Contact with contaminated hypodermic needle, initial encounter: Secondary | ICD-10-CM | POA: Diagnosis not present

## 2016-11-14 DIAGNOSIS — S61239A Puncture wound without foreign body of unspecified finger without damage to nail, initial encounter: Secondary | ICD-10-CM

## 2016-11-14 DIAGNOSIS — Y92009 Unspecified place in unspecified non-institutional (private) residence as the place of occurrence of the external cause: Secondary | ICD-10-CM | POA: Insufficient documentation

## 2016-11-14 DIAGNOSIS — W273XXA Contact with needle (sewing), initial encounter: Secondary | ICD-10-CM | POA: Diagnosis not present

## 2016-11-14 DIAGNOSIS — Y999 Unspecified external cause status: Secondary | ICD-10-CM | POA: Diagnosis not present

## 2016-11-14 DIAGNOSIS — Y9389 Activity, other specified: Secondary | ICD-10-CM | POA: Diagnosis not present

## 2016-11-14 DIAGNOSIS — IMO0001 Reserved for inherently not codable concepts without codable children: Secondary | ICD-10-CM

## 2016-11-14 DIAGNOSIS — S6982XA Other specified injuries of left wrist, hand and finger(s), initial encounter: Secondary | ICD-10-CM | POA: Diagnosis not present

## 2016-11-14 DIAGNOSIS — Z7721 Contact with and (suspected) exposure to potentially hazardous body fluids: Secondary | ICD-10-CM | POA: Diagnosis not present

## 2016-11-14 DIAGNOSIS — S6992XA Unspecified injury of left wrist, hand and finger(s), initial encounter: Secondary | ICD-10-CM | POA: Diagnosis not present

## 2016-11-14 MED ORDER — TETANUS-DIPHTH-ACELL PERTUSSIS 5-2.5-18.5 LF-MCG/0.5 IM SUSP
0.5000 mL | Freq: Once | INTRAMUSCULAR | Status: AC
  Administered 2016-11-14: 0.5 mL via INTRAMUSCULAR
  Filled 2016-11-14: qty 0.5

## 2016-11-14 NOTE — ED Triage Notes (Signed)
Pt states accidental needle stick. Pt states stuck in L 4th finger. Pt states used needle was originally epi-pen, needle being using for drugs. No bleeding or discharge from site at triage. Site clean, dry. No redness or swelling.

## 2016-11-14 NOTE — ED Provider Notes (Signed)
[facility] DEPT Provider Note   CSN: [id number] Arrival date & time: 11/14/16  2046   By signing my name below, I, Soijett Blue, attest that this documentation has been prepared under the direction and in the presence of Charlann Lange, PA-C Electronically Signed: Soijett Blue, ED Scribe. 11/14/16. 11:18 PM.  History   Chief Complaint Chief Complaint  Patient presents with  . Body Fluid Exposure    HPI [name] is a 66 y.o. male who presents to the Emergency Department complaining of body fluid exposure onset PTA. Pt notes that he picked up a bag and accidentally pricked his left ring finger with his bosses used auto injector needle. Pt reports that his boss uses the epi-pen auto injector for administration of Rx human growth hormone. Pt also notes that his bosses used needles were several months old. Pt states that his boss doesn't have HIV. He hasn't tried any medications for the relief for his symptoms. He denies numbness, tingling, left hand pain, right hand pain, and any other symptoms.    The history is provided by the patient. No language interpreter was used.    Past Medical History:  Diagnosis Date  . Back pain    chronic  . GERD (gastroesophageal reflux disease)   . Obesity   . OSA (obstructive sleep apnea)     Patient Active Problem List   Diagnosis Date Noted  . Hypertriglyceridemia 12/23/2014  . Abnormal finding on CT scan 12/22/2014  . Skin lesion of face 12/22/2014  . Acute bronchitis 12/17/2014  . Phlebitis and thrombophlebitis 02/25/2014  . Ingrown toenail 02/25/2014  . Back pain, thoracic 10/12/2013  . Plantar wart of left foot 09/12/2013  . Epistaxis 12/19/2012  . Onychomycosis 12/19/2012  . SHOULDER PAIN, LEFT, CHRONIC 06/15/2010  . MORBID OBESITY 08/31/2009  . INTERMITTENT VERTIGO 08/28/2009  . PALPITATIONS 05/08/2009  . Obstructive sleep apnea 08/09/2007  . GERD 08/09/2007    Past Surgical History:  Procedure Laterality Date  .  HEMANGIOMA EXCISION  1990   left leg       Home Medications    Prior to Admission medications   Medication Sig Start Date End Date Taking? Authorizing Provider  aspirin EC 81 MG tablet Take 81 mg by mouth daily. Reported on 02/18/2016    Historical Provider, MD  doxycycline (VIBRAMYCIN) 100 MG capsule Take 1 capsule (100 mg total) by mouth 2 (two) times daily. 09/05/16   Brunetta Jeans, PA-C    Family History Family History  Problem Relation Age of Onset  . Arthritis Mother     spine  . Heart disease Mother   . Hypertension Mother   . Cancer Mother     bone  . Heart disease Brother     murmur    Social History Social History  Substance Use Topics  . Smoking status: Never Smoker  . Smokeless tobacco: Never Used  . Alcohol use Yes     Allergies   Other and Mango flavor [flavoring agent]   Review of Systems Review of Systems  Musculoskeletal: Negative for arthralgias and joint swelling.  Skin: Negative for color change.  Neurological: Negative for numbness.       No tingling     Physical Exam Updated Vital Signs BP 161/84 (BP Location: Right Arm)   Pulse 62   Temp 97.3 F (36.3 C) (Oral)   Resp 22   SpO2 99%   Physical Exam  Constitutional: He is oriented to person, place, and time. He appears well-developed  and well-nourished. No distress.  HENT:  Head: Normocephalic and atraumatic.  Eyes: EOM are normal.  Neck: Neck supple.  Cardiovascular: Normal rate.   Pulmonary/Chest: Effort normal. No respiratory distress.  Abdominal: He exhibits no distension.  Musculoskeletal: Normal range of motion.  Right index and left ring finger have no redness, swelling, or induration. No puncture wound visualized. Full ROM.   Neurological: He is alert and oriented to person, place, and time.  Skin: Skin is warm and dry.  Psychiatric: He has a normal mood and affect. His behavior is normal.  Nursing note and vitals reviewed.    ED Treatments / Results    DIAGNOSTIC STUDIES: Oxygen Saturation is 99% on RA, nl by my interpretation.    COORDINATION OF CARE: 11:13 PM Discussed treatment plan with pt at bedside and pt agreed to plan.   Procedures Procedures (including critical care time)  Medications Ordered in ED Medications - No data to display   Initial Impression / Assessment and Plan / ED Course  I have reviewed the triage vital signs and the nursing notes.   Clinical Course     1. Accidental Needle stick   Patient presents after needle stick with injector used by his employer to administer Human growth hormone. The injectors were fully exhausted but had been collecting for several weeks. No abnormality at the sites of stick. Will cover with Keflex and encourage PCP follow up with any change to wounds. Transmission of communicable disease felt extremely low risk and testing not pursued. Patient agreeable to plan and is appropriate for discharge.   Final Clinical Impressions(s) / ED Diagnoses   Final diagnoses:  None    New Prescriptions New Prescriptions   No medications on file   I personally performed the services described in this documentation, which was scribed in my presence. The recorded information has been reviewed and is accurate.      Charlann Lange, PA-C 11/17/16 [place], MD 11/17/16 [phone]

## 2016-11-14 NOTE — ED Notes (Signed)
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 Pt stated there was a very small tiny bit a blood. Pt says there is no open area. Pt stated he squeezed and nothing else came about. Pt has the epi- pens in the room with him and stuck himself again while the nurse was in the room.

## 2016-11-15 ENCOUNTER — Encounter: Payer: Self-pay | Admitting: Internal Medicine

## 2016-11-15 ENCOUNTER — Ambulatory Visit (INDEPENDENT_AMBULATORY_CARE_PROVIDER_SITE_OTHER): Payer: BLUE CROSS/BLUE SHIELD | Admitting: Internal Medicine

## 2016-11-15 VITALS — BP 134/80 | HR 78 | Temp 98.0°F | Resp 14 | Ht 70.0 in | Wt 295.5 lb

## 2016-11-15 DIAGNOSIS — IMO0001 Reserved for inherently not codable concepts without codable children: Secondary | ICD-10-CM

## 2016-11-15 DIAGNOSIS — W273XXD Contact with needle (sewing), subsequent encounter: Secondary | ICD-10-CM | POA: Diagnosis not present

## 2016-11-15 DIAGNOSIS — S61235D Puncture wound without foreign body of left ring finger without damage to nail, subsequent encounter: Secondary | ICD-10-CM | POA: Diagnosis not present

## 2016-11-15 NOTE — Progress Notes (Signed)
Subjective:    Patient ID: [name], male    DOB: [birth year], 66 y.o.   MRN: [id number]  DOS:  11/15/2016 Type of visit - description :  Acute visit Interval history: The patient suffer a accidental needle stick ti to his L 4th finger yesterday -11-14-16-  at 6 PM. The needle is used by his boss to self  inject growth hormone, it did penetrate the skin and caused minimal bleeding. The needle was use anywhere from 2 weeks to a few months ago. The source  HIV, hepatitis B and C is unknown. Reportedly is a very low risk patient for infectious diseases, he has been wheelchair bound for many years    [name] himself feels very well. He has never been vaccinated against hepatitis B that he recalls.   Review of Systems   Past Medical History:  Diagnosis Date  . Back pain    chronic  . GERD (gastroesophageal reflux disease)   . Obesity   . OSA (obstructive sleep apnea)     Past Surgical History:  Procedure Laterality Date  . HEMANGIOMA EXCISION  1990   left leg    Social History   Social History  . Marital status: Single    Spouse name: N/A  . Number of children: N/A  . Years of education: N/A   Occupational History  . Not on file.   Social History Main Topics  . Smoking status: Never Smoker  . Smokeless tobacco: Never Used  . Alcohol use Yes  . Drug use: No  . Sexual activity: Not on file   Other Topics Concern  . Not on file   Social History Narrative  . No narrative on file      Allergies as of 11/15/2016      Reactions   Other    Anesthetic "pentathol" caused hallucinations over 48 hrs   Mango Flavor [flavoring Agent] Rash   Mango tree sap      Medication List    as of 11/15/2016 11:59 PM   You have not been prescribed any medications.        Objective:   Physical Exam BP 134/80 (BP Location: Left Arm, Patient Position: Sitting, Cuff Size: Normal)   Pulse 78   Temp 98 F (36.7 C) (Oral)   Resp 14   Ht 5\' 10"  (1.778 m)   Wt 295 lb  8 oz (134 kg)   SpO2 98%   BMI 42.40 kg/m  General:   Well developed, well nourished . NAD.  HEENT:  Normocephalic . Face symmetric, atraumatic Skin: Not pale. Not jaundice. Area of the needle stick is normal to inspection   Neurologic:  alert & oriented X3.  Speech normal, gait appropriate for age and unassisted Psych--  Cognition and judgment appear intact.  Cooperative with normal attention span and concentration.  Behavior appropriate. Slightly anxious but not depressed appearing.      Assessment & Plan:   66 y/o pt w/ obesity, OSA onCPAP here d/t: Needle stick injury:  Was seen at the ER yesterday, provided a Td booster. No further testing done. Guidelines reviewed with the patient. Pt  himself does not recall previous vaccination for hepatitis B. Source individual Hep B-C-HIV is unknown. Plan:  Will obtain  hepatitis B surface Ag, Hep B surface antibody, hepatitis C and HIV. Request the source to be tested for hepatitis B surface antigen, HCV RNA, HIV. Patient reports any sort of illness of viral syndrome within the next  few weeks See PCP in 2 months.   Today, I spent more than   30 min with the patient: >50% of the time counseling regards current guidelines , pros-cons of further testing

## 2016-11-15 NOTE — Patient Instructions (Signed)
GO TO THE LAB : Get the blood work     GO TO THE FRONT DESK Schedule your next appointment for a  routine checkup in 4 weeks  with your primary doctor  Is indicated to check  the source be check for the following: Hepatitis B S AG Hep C RNA HIV If you are able to get those tests, please forward them to [place]  Call anytime if you get sick in the next few weeks

## 2016-11-15 NOTE — Progress Notes (Signed)
Pre visit review using our clinic review tool, if applicable. No additional management support is needed unless otherwise documented below in the visit note. 

## 2016-11-18 ENCOUNTER — Other Ambulatory Visit: Payer: BLUE CROSS/BLUE SHIELD

## 2016-11-19 LAB — HIV ANTIBODY (ROUTINE TESTING W REFLEX): HIV: NONREACTIVE

## 2016-11-19 LAB — HEPATITIS C ANTIBODY: HCV AB: NEGATIVE

## 2016-11-19 LAB — HEPATITIS B SURFACE ANTIGEN: Hepatitis B Surface Ag: NEGATIVE

## 2016-11-19 LAB — HEPATITIS B SURFACE ANTIBODY,QUALITATIVE

## 2017-07-07 ENCOUNTER — Ambulatory Visit (HOSPITAL_BASED_OUTPATIENT_CLINIC_OR_DEPARTMENT_OTHER)
Admission: RE | Admit: 2017-07-07 | Discharge: 2017-07-07 | Disposition: A | Discharge status: Home / Self Care | Payer: BLUE CROSS/BLUE SHIELD | Source: Ambulatory Visit | Attending: Family | Admitting: Family

## 2017-07-07 ENCOUNTER — Telehealth: Payer: Self-pay | Admitting: Family

## 2017-07-07 ENCOUNTER — Ambulatory Visit (INDEPENDENT_AMBULATORY_CARE_PROVIDER_SITE_OTHER): Payer: BLUE CROSS/BLUE SHIELD | Admitting: Family

## 2017-07-07 ENCOUNTER — Encounter (HOSPITAL_BASED_OUTPATIENT_CLINIC_OR_DEPARTMENT_OTHER): Payer: Self-pay

## 2017-07-07 ENCOUNTER — Encounter: Payer: Self-pay | Admitting: Family

## 2017-07-07 VITALS — BP 129/64 | HR 59 | Temp 98.2°F | Ht 70.0 in | Wt 270.0 lb

## 2017-07-07 DIAGNOSIS — S62632A Displaced fracture of distal phalanx of right middle finger, initial encounter for closed fracture: Secondary | ICD-10-CM | POA: Diagnosis not present

## 2017-07-07 DIAGNOSIS — X58XXXA Exposure to other specified factors, initial encounter: Secondary | ICD-10-CM | POA: Insufficient documentation

## 2017-07-07 DIAGNOSIS — S6710XA Crushing injury of unspecified finger(s), initial encounter: Secondary | ICD-10-CM | POA: Diagnosis not present

## 2017-07-07 DIAGNOSIS — S62662A Nondisplaced fracture of distal phalanx of right middle finger, initial encounter for closed fracture: Secondary | ICD-10-CM

## 2017-07-07 NOTE — Telephone Encounter (Signed)
Please advise patient that x-ray does show fracture of the tip of his middle finger. I have placed a referral to sports medicine for further evaluation.

## 2017-07-07 NOTE — Progress Notes (Signed)
   Subjective:    Patient ID: [name], male    DOB: 07-22-1951, 66 y.o.   MRN: [id number]  HPI  [name] is a 66 yr old male who presents today To discuss crush injury of his right middle finger. He reports that this occurred 8 days ago. His finger was crushed in a metal hinge. He denies redness or drainage from the area. He reports continued tenderness at the distal aspect of the finger. He continues to apply antibiotic ointment and bandage daily.  Review of Systems    see HPI  Past Medical History:  Diagnosis Date  . Back pain    chronic  . GERD (gastroesophageal reflux disease)   . Obesity   . OSA (obstructive sleep apnea)      Social History   Social History  . Marital status: Single    Spouse name: N/A  . Number of children: N/A  . Years of education: N/A   Occupational History  . Not on file.   Social History Main Topics  . Smoking status: Never Smoker  . Smokeless tobacco: Never Used  . Alcohol use Yes  . Drug use: No  . Sexual activity: Not on file   Other Topics Concern  . Not on file   Social History Narrative  . No narrative on file    Past Surgical History:  Procedure Laterality Date  . HEMANGIOMA EXCISION  1990   left leg    Family History  Problem Relation Age of Onset  . Arthritis Mother        spine  . Heart disease Mother   . Hypertension Mother   . Cancer Mother        bone  . Heart disease Brother        murmur    Allergies  Allergen Reactions  . Other     Anesthetic "pentathol" caused hallucinations over 48 hrs  . Mango Flavor [Flavoring Agent] Rash    Mango tree sap    No current outpatient prescriptions on file prior to visit.   No current facility-administered medications on file prior to visit.     BP 129/64   Pulse (!) 59   Temp 98.2 F (36.8 C) (Oral)   Ht 5\' 10"  (1.778 m)   Wt 270 lb (122.5 kg)   SpO2 98%   BMI 38.74 kg/m    Objective:   Physical Exam  Constitutional: He is oriented to  person, place, and time. He appears well-developed and well-nourished. No distress.  Musculoskeletal: He exhibits no edema.  Neurological: He is alert and oriented to person, place, and time.  Skin: Skin is warm and dry.  Some swelling and scabbing noted of the right distal middle finger. No erythema is noted. No drainage.  Psychiatric: He has a normal mood and affect. His behavior is normal. Judgment and thought content normal.            Assessment & Plan:  Crush injury of right middle finger-will obtain an x-ray of the finger to rule out fracture. I've advised the patient to continue daily dressing changes. He is to call if area becomes more swollen, if drainage occurs or if erythema. Patient verbalizes understanding.

## 2017-07-07 NOTE — Telephone Encounter (Signed)
Notified pt. 

## 2017-07-07 NOTE — Patient Instructions (Signed)
Please complete x ray on the first floor. Continue to apply antibiotic ointment and bandage to right middle finger until healed. Call if you develop swelling, redness/drainage.

## 2017-07-13 ENCOUNTER — Ambulatory Visit: Payer: Self-pay | Admitting: Family Medicine

## 2017-07-19 ENCOUNTER — Ambulatory Visit: Payer: BLUE CROSS/BLUE SHIELD | Admitting: Family Medicine

## 2017-07-20 ENCOUNTER — Encounter: Payer: Self-pay | Admitting: Family Medicine

## 2017-07-20 ENCOUNTER — Ambulatory Visit (INDEPENDENT_AMBULATORY_CARE_PROVIDER_SITE_OTHER): Payer: BLUE CROSS/BLUE SHIELD | Admitting: Family Medicine

## 2017-07-20 DIAGNOSIS — S6991XA Unspecified injury of right wrist, hand and finger(s), initial encounter: Secondary | ICD-10-CM

## 2017-07-20 MED ORDER — AMOXICILLIN-POT CLAVULANATE 875-125 MG PO TABS: 1.0000 | ORAL_TABLET | Freq: Two times a day (BID) | ORAL | 0 refills | Status: DC

## 2017-07-20 NOTE — Patient Instructions (Signed)
You have an open tuft fracture of your distal phalanx. We removed the last bit of nail that was in the wound. Change the dressing over the next couple days then this should be crusted over with granulation tissue. You don't need to splint this type of fracture any longer - they heal over 4-6 weeks. Repeat x-rays are not necessary for these fractures. Take augmentin twice a day for 7 days. Follow up with me in 3 weeks only if needed.

## 2017-07-24 DIAGNOSIS — S6991XA Unspecified injury of right wrist, hand and finger(s), initial encounter: Secondary | ICD-10-CM | POA: Insufficient documentation

## 2017-07-24 NOTE — Assessment & Plan Note (Signed)
independently reviewed radiographs and noted tuft fracture of distal phalanx.  Open with laceration he had of nail though improving clinically and no evidence infection.  Given open fracture of finger recommend prophylaxis with 7 days of augmentin after removal of nail fragment today with forceps (easily removed but noted open area still with small amount of bleeding where this was removed).  Change dressing daily until this is crusted over.  F/u in 3 weeks if needed - advised expect full healing by that time.  Repeat radiographs unnecessary for this type of fracture unless still having problems then.

## 2017-07-24 NOTE — Progress Notes (Signed)
PCP and consultation requested by: Debbrah Alar, NP  Subjective:   HPI: Patient is a 66 y.o. male here for right finger injury.  Patient reports on 8/30 he had 2 pieces of a metal hinge come together and smash distal right middle finger. Pain level down to 0/10 but gets soreness distally. Associated bleeding, some swelling still. Keeping this wrapped/covered. Concerned about fingernail being stuck in wound when was crushed. No prior injuries. No redness, fever, chills, sweats. Right handed.  Past Medical History:  Diagnosis Date  . Back pain    chronic  . GERD (gastroesophageal reflux disease)   . Obesity   . OSA (obstructive sleep apnea)     No current outpatient prescriptions on file prior to visit.   No current facility-administered medications on file prior to visit.     Past Surgical History:  Procedure Laterality Date  . HEMANGIOMA EXCISION  1990   left leg    Allergies  Allergen Reactions  . Other     Anesthetic "pentathol" caused hallucinations over 48 hrs  . Mango Flavor [Flavoring Agent] Rash    Mango tree sap    Social History   Social History  . Marital status: Single    Spouse name: N/A  . Number of children: N/A  . Years of education: N/A   Occupational History  . Not on file.   Social History Main Topics  . Smoking status: Never Smoker  . Smokeless tobacco: Never Used  . Alcohol use Yes  . Drug use: No  . Sexual activity: Not on file   Other Topics Concern  . Not on file   Social History Narrative  . No narrative on file    Family History  Problem Relation Age of Onset  . Arthritis Mother        spine  . Heart disease Mother   . Hypertension Mother   . Cancer Mother        bone  . Heart disease Brother        murmur    BP 126/76   Pulse 64   Ht 5\' 11"  (1.803 m)   Wt 268 lb (121.6 kg)   BMI 37.38 kg/m   Review of Systems: See HPI above.     Objective:  Physical Exam:  Gen: NAD, comfortable in exam  room  Right 3rd digit: No malrotation, angulation.  Distal midportion of nail avulsed and embedded in fingerpad of 3rd digit distally.  No visible laceration of nailbed.  No erythema.  No evidence of felon or paronychia. Minimal TTP distal phalanx.  No other tenderness. FROM at MCP, PIP, DIP joints with 5/5 strength. Collateral ligaments intact at these digits. NVI distally.   Assessment & Plan:  1. Right 3rd digit injury - independently reviewed radiographs and noted tuft fracture of distal phalanx.  Open with laceration he had of nail though improving clinically and no evidence infection.  Given open fracture of finger recommend prophylaxis with 7 days of augmentin after removal of nail fragment today with forceps (easily removed but noted open area still with small amount of bleeding where this was removed).  Change dressing daily until this is crusted over.  F/u in 3 weeks if needed - advised expect full healing by that time.  Repeat radiographs unnecessary for this type of fracture unless still having problems then.

## 2018-07-05 DIAGNOSIS — M25522 Pain in left elbow: Secondary | ICD-10-CM | POA: Diagnosis not present

## 2018-07-06 DIAGNOSIS — M25522 Pain in left elbow: Secondary | ICD-10-CM | POA: Diagnosis not present

## 2018-07-10 DIAGNOSIS — M25522 Pain in left elbow: Secondary | ICD-10-CM | POA: Diagnosis not present

## 2018-07-12 DIAGNOSIS — M25522 Pain in left elbow: Secondary | ICD-10-CM | POA: Diagnosis not present

## 2018-07-12 DIAGNOSIS — S52125A Nondisplaced fracture of head of left radius, initial encounter for closed fracture: Secondary | ICD-10-CM | POA: Diagnosis not present

## 2018-07-19 ENCOUNTER — Encounter (HOSPITAL_COMMUNITY): Payer: Self-pay

## 2018-07-19 ENCOUNTER — Other Ambulatory Visit: Payer: Self-pay

## 2018-07-19 ENCOUNTER — Ambulatory Visit: Payer: Self-pay | Admitting: Family

## 2018-07-19 MED ORDER — DEXTROSE 5 % IV SOLN
3.0000 g | INTRAVENOUS | Status: AC
  Administered 2018-07-20: 3 g via INTRAVENOUS
  Filled 2018-07-19: qty 3

## 2018-07-19 NOTE — H&P (Signed)
[name] is an 67 y.o. male.   Chief Complaint: LEFT ELBOW INJURY  HPI:  [name] is a 67 y/o right hand dominant male who injured his left elbow about 4 weeks ago.  He has had complaints of pain, weakness, and stiffness. He was seen in our practice where he had radiographs and MRI done which showed a proximal radius fracture and ulnar collateral ligament tear. Discussed the reason and rationale for surgical intervention. Discussed the surgical procedure and the post-operative recovery.  The patient was put into a long arm splint.  He is here today for surgery.  He denies any fever, chills, nausea, vomiting, diarrhea, chest pain, or shortness of breath.     Past Medical History:  Diagnosis Date  . Back pain    chronic  . GERD (gastroesophageal reflux disease)   . Obesity   . OSA (obstructive sleep apnea)     Past Surgical History:  Procedure Laterality Date  . HEMANGIOMA EXCISION  1990   left leg    Family History  Problem Relation Age of Onset  . Arthritis Mother        spine  . Heart disease Mother   . Hypertension Mother   . Cancer Mother        bone  . Heart disease Brother        murmur   Social History:  reports that he has never smoked. He has never used smokeless tobacco. He reports that he drinks alcohol. He reports that he does not use drugs.  Allergies:  Allergies  Allergen Reactions  . Other Other (See Comments)    Anesthetic "pentathol" caused hallucinations over 48 hrs  . Mango Flavor [Flavoring Agent] Rash and Other (See Comments)    Mango tree sap    No medications prior to admission.    No results found for this or any previous visit (from the past 48 hour(s)). No results found.  ROS NO RECENT ILLNESSES OR HOSPITALIZATIONS   There were no vitals taken for this visit. Physical Exam  General Appearance:  Alert, cooperative, no distress, appears stated age  Head:  Normocephalic, without obvious abnormality, atraumatic  Eyes:  Pupils  equal, conjunctiva/corneas clear,         Throat: Lips, mucosa, and tongue normal; teeth and gums normal  Neck: No visible masses     Lungs:   respirations unlabored  Chest Wall:  No tenderness or deformity  Heart:  Regular rate and rhythm,  Abdomen:   Soft, non-tender,         Extremities: LUE: SKIN INTACT, FINGERS WARM WELL PERFUSED ABLE TO EXTEND THUMB AND EXTEND FINGERS   Pulses: 2+ and symmetric  Skin: Skin color, texture, turgor normal, no rashes or lesions     Neurologic: Normal    Assessment LEFT ELBOW PROXIMAL RADIUS FRACTURE AND LATERAL ULNAR COLLATERAL LIGAMENT TEAR   Plan LEFT ELBOW OPEN REDUCTION AND INTERNAL FIXATION WITH REPAIR AS INDICATED OF THE LIGAMENT AND BONE   WE ARE PLANNING SURGERY FOR YOUR UPPER EXTREMITY. THE RISKS AND BENEFITS OF SURGERY INCLUDE BUT NOT LIMITED TO BLEEDING INFECTION, DAMAGE TO NEARBY NERVES ARTERIES TENDONS, FAILURE OF SURGERY TO ACCOMPLISH ITS INTENDED GOALS, PERSISTENT SYMPTOMS AND NEED FOR FURTHER SURGICAL INTERVENTION. WITH THIS IN MIND WE WILL PROCEED. I HAVE DISCUSSED WITH THE PATIENT THE PRE AND POSTOPERATIVE REGIMEN AND THE DOS AND DON'TS. PT VOICED UNDERSTANDING AND INFORMED CONSENT SIGNED.  R/B/A DISCUSSED WITH PT IN OFFICE.  PT VOICED UNDERSTANDING OF PLAN  CONSENT SIGNED DAY OF SURGERY PT SEEN AND EXAMINED PRIOR TO OPERATIVE PROCEDURE/DAY OF SURGERY SITE MARKED. QUESTIONS ANSWERED WILL [facility] HOME FOLLOWING SURGERY  Demyah Smyre Caralyn Guile, MD 07/20/2018  Brynda Peon 07/19/2018, 8:28 AM

## 2018-07-19 NOTE — Progress Notes (Addendum)
Pre-op phone call complete. Pt denies any cardiac hx. Pt denies having a cardiologist. Echo possibly done 4 years ago prior to knee surgery with Dr. Veverly Fells but pt unsure of where the study was done. No records in Epic. All questions and concerns have been addressed at this time.   Jacqlyn Larsen, RN

## 2018-07-19 NOTE — Telephone Encounter (Signed)
Incoming call from Patient who states that he is to have elbow surgery tomorrow Friday 07/19/18. States that he has been dizzy and having SOB.    Rates the lightheadedness as mild.  States it started 3 days ago.  Denies any aggravating factors.  Heart rate was 68.  Patient states when he " climbs stairs because he weighs 280lbs he becomes short of breath "  Patient called office to let us know about these symptoms prior to his surgery. Inquired as to who was doing his surgery.   Patient states   Dr. Veverly Fells would be doing it.  Related to patient that he should also contact him.  Related to patient that I would route the call info to Dr.  Tonny Branch office.  Patient has contacted Dr.  Veverly Fells office (surgeon).   Reason for Disposition . [1] MILD longstanding difficulty breathing AND [2]  SAME as normal . [1] MILD dizziness (e.g., walking normally) AND [2] has been evaluated by physician for this  Answer Assessment - Initial Assessment Questions 1. RESPIRATORY STATUS: "Describe your breathing?" (e.g., wheezing, shortness of breath, unable to speak, severe coughing)      *No Answer* 2. ONSET: "When did this breathing problem begin?"      *No Answer* 3. PATTERN "Does the difficult breathing come and go, or has it been constant since it started?"      Come s and goes 4. SEVERITY: "How bad is your breathing?" (e.g., mild, moderate, severe)    - MILD: No SOB at rest, mild SOB with walking, speaks normally in sentences, can lay down, no retractions, pulse < 100.    - MODERATE: SOB at rest, SOB with minimal exertion and prefers to sit, cannot lie down flat, speaks in phrases, mild retractions, audible wheezing, pulse 100-120.    - SEVERE: Very SOB at rest, speaks in single words, struggling to breathe, sitting hunched forward, retractions, pulse > 120      mild 5. RECURRENT SYMPTOM: "Have you had difficulty breathing before?" If so, ask: "When was the last time?" and "What happened that time?"     6. CARDIAC  HISTORY: "Do you have any history of heart disease?" (e.g., heart attack, angina, bypass surgery, angioplasty)      no 7. LUNG HISTORY: "Do you have any history of lung disease?"  (e.g., pulmonary embolus, asthma, emphysema)     *No Answer* 8. CAUSE: "What do you think is causing the breathing problem?"      *No Answer* 9. OTHER SYMPTOMS: "Do you have any other symptoms? (e.g., dizziness, runny nose, cough, chest pain, fever)     denist pain10. PREGNANCY: "Is there any chance you are pregnant?" "When was your last menstrual period?"      na 11. TRAVEL: "Have you traveled out of the country in the last month?" (e.g., travel history, exposures)       na  Answer Assessment - Initial Assessment Questions 1. DESCRIPTION: "Describe your dizziness."    2. LIGHTHEADED: "Do you feel lightheaded?" (e.g., somewhat faint, woozy, weak upon standing)     When standinging up3. VERTIGO: "Do you feel like either you or the room is spinning or tilting?" (i.e. vertigo)     no 4. SEVERITY: "How bad is it?"  "Do you feel like you are going to faint?" "Can you stand and walk?"   - MILD - walking normally   - MODERATE - interferes with normal activities (e.g., work, school)    - SEVERE - unable to stand,  requires support to walk, feels like passing out now.     mild 5. ONSET:  "When did the dizziness begin?"     3 days ago 6. AGGRAVATING FACTORS: "Does anything make it worse?" (e.g., standing, change in head position)    Not pacifically 7. HEART RATE: "Can you tell me your heart rate?" "How many beats in 15 seconds?"  (Note: not all patients can do this)        8. CAUSE: "What do you think is causing the dizziness?"     9. RECURRENT SYMPTOM: "Have you had dizziness before?" If so, ask: "When was the last time?" "What happened that time?" 2 times in the       10. OTHER SYMPTOMS: "Do you have any other symptoms?" (e.g., fever, chest pain, vomiting, diarrhea, bleeding)       Seldom have pain threshold of pain  is high.   11. PREGNANCY: "Is there any chance you are pregnant?" "When was your last menstrual period?"       na  Protocols used: BREATHING DIFFICULTY-A-AH, DIZZINESS - [facility]

## 2018-07-20 ENCOUNTER — Ambulatory Visit (HOSPITAL_COMMUNITY): Payer: BLUE CROSS/BLUE SHIELD | Admitting: Anesthesiology

## 2018-07-20 ENCOUNTER — Encounter (HOSPITAL_COMMUNITY): Payer: Self-pay | Admitting: *Deleted

## 2018-07-20 ENCOUNTER — Other Ambulatory Visit: Payer: Self-pay

## 2018-07-20 ENCOUNTER — Ambulatory Visit (HOSPITAL_COMMUNITY)
Admission: RE | Admit: 2018-07-20 | Discharge: 2018-07-20 | Disposition: A | Discharge status: Home / Self Care | Payer: BLUE CROSS/BLUE SHIELD | Source: Ambulatory Visit | Attending: Orthopedic Surgery | Admitting: Orthopedic Surgery

## 2018-07-20 ENCOUNTER — Encounter (HOSPITAL_COMMUNITY)
Admission: RE | Disposition: A | Discharge status: Home / Self Care | Payer: Self-pay | Source: Ambulatory Visit | Attending: Orthopedic Surgery

## 2018-07-20 DIAGNOSIS — S52122A Displaced fracture of head of left radius, initial encounter for closed fracture: Secondary | ICD-10-CM | POA: Insufficient documentation

## 2018-07-20 DIAGNOSIS — Z91040 Latex allergy status: Secondary | ICD-10-CM | POA: Insufficient documentation

## 2018-07-20 DIAGNOSIS — Z884 Allergy status to anesthetic agent status: Secondary | ICD-10-CM | POA: Diagnosis not present

## 2018-07-20 DIAGNOSIS — G4733 Obstructive sleep apnea (adult) (pediatric): Secondary | ICD-10-CM | POA: Diagnosis not present

## 2018-07-20 DIAGNOSIS — Z6839 Body mass index (BMI) 39.0-39.9, adult: Secondary | ICD-10-CM | POA: Insufficient documentation

## 2018-07-20 DIAGNOSIS — S52125A Nondisplaced fracture of head of left radius, initial encounter for closed fracture: Secondary | ICD-10-CM | POA: Diagnosis not present

## 2018-07-20 DIAGNOSIS — I10 Essential (primary) hypertension: Secondary | ICD-10-CM | POA: Diagnosis not present

## 2018-07-20 DIAGNOSIS — S52122B Displaced fracture of head of left radius, initial encounter for open fracture type I or II: Secondary | ICD-10-CM | POA: Diagnosis not present

## 2018-07-20 DIAGNOSIS — X58XXXA Exposure to other specified factors, initial encounter: Secondary | ICD-10-CM | POA: Insufficient documentation

## 2018-07-20 DIAGNOSIS — K219 Gastro-esophageal reflux disease without esophagitis: Secondary | ICD-10-CM | POA: Diagnosis not present

## 2018-07-20 DIAGNOSIS — S53442A Ulnar collateral ligament sprain of left elbow, initial encounter: Secondary | ICD-10-CM | POA: Diagnosis not present

## 2018-07-20 HISTORY — PX: ORIF RADIAL FRACTURE: SHX5113

## 2018-07-20 LAB — CBC
HEMATOCRIT: 47.1 % (ref 39.0–52.0)
HEMOGLOBIN: 15.9 g/dL (ref 13.0–17.0)
MCH: 29.1 pg (ref 26.0–34.0)
MCHC: 33.8 g/dL (ref 30.0–36.0)
MCV: 86.3 fL (ref 78.0–100.0)
Platelets: 200 10*3/uL (ref 150–400)
RBC: 5.46 MIL/uL (ref 4.22–5.81)
RDW: 13.2 % (ref 11.5–15.5)
WBC: 8.1 10*3/uL (ref 4.0–10.5)

## 2018-07-20 SURGERY — OPEN REDUCTION INTERNAL FIXATION (ORIF) RADIAL FRACTURE
Anesthesia: Regional | Site: Elbow | Laterality: Left

## 2018-07-20 MED ORDER — FENTANYL CITRATE (PF) 250 MCG/5ML IJ SOLN
INTRAMUSCULAR | Status: DC | PRN
  Administered 2018-07-20: 25 ug via INTRAVENOUS

## 2018-07-20 MED ORDER — FENTANYL CITRATE (PF) 100 MCG/2ML IJ SOLN
INTRAMUSCULAR | Status: AC
  Filled 2018-07-20: qty 2

## 2018-07-20 MED ORDER — MIDAZOLAM HCL 2 MG/2ML IJ SOLN
INTRAMUSCULAR | Status: AC
  Administered 2018-07-20: 2 mg via INTRAVENOUS
  Filled 2018-07-20: qty 2

## 2018-07-20 MED ORDER — PROPOFOL 500 MG/50ML IV EMUL
INTRAVENOUS | Status: DC | PRN
  Administered 2018-07-20: 50 ug/kg/min via INTRAVENOUS

## 2018-07-20 MED ORDER — MIDAZOLAM HCL 2 MG/2ML IJ SOLN
INTRAMUSCULAR | Status: AC
  Filled 2018-07-20: qty 2

## 2018-07-20 MED ORDER — 0.9 % SODIUM CHLORIDE (POUR BTL) OPTIME
TOPICAL | Status: DC | PRN
  Administered 2018-07-20: 1000 mL

## 2018-07-20 MED ORDER — LACTATED RINGERS IV SOLN
INTRAVENOUS | Status: DC
  Administered 2018-07-20: 14:00:00 via INTRAVENOUS

## 2018-07-20 MED ORDER — CHLORHEXIDINE GLUCONATE 4 % EX LIQD: 60.0000 mL | Freq: Once | CUTANEOUS | Status: DC

## 2018-07-20 MED ORDER — FENTANYL CITRATE (PF) 250 MCG/5ML IJ SOLN
INTRAMUSCULAR | Status: AC
  Filled 2018-07-20: qty 5

## 2018-07-20 MED ORDER — PROPOFOL 10 MG/ML IV BOLUS
INTRAVENOUS | Status: AC
  Filled 2018-07-20: qty 40

## 2018-07-20 MED ORDER — BUPIVACAINE HCL (PF) 0.25 % IJ SOLN
INTRAMUSCULAR | Status: AC
  Filled 2018-07-20: qty 30

## 2018-07-20 MED ORDER — MIDAZOLAM HCL 2 MG/2ML IJ SOLN
2.0000 mg | Freq: Once | INTRAMUSCULAR | Status: AC
  Administered 2018-07-20: 2 mg via INTRAVENOUS

## 2018-07-20 MED ORDER — BUPIVACAINE HCL (PF) 0.25 % IJ SOLN
INTRAMUSCULAR | Status: DC | PRN
  Administered 2018-07-20: 10 mL

## 2018-07-20 MED ORDER — MIDAZOLAM HCL 2 MG/2ML IJ SOLN
INTRAMUSCULAR | Status: DC | PRN
  Administered 2018-07-20: 0.5 mg via INTRAVENOUS

## 2018-07-20 SURGICAL SUPPLY — 54 items
ANCH SUT 1 SHRT SM RGD INSRTR (Anchor) ×2 IMPLANT
ANCHOR SUT 1.45 SZ 1 SHORT (Anchor) ×2 IMPLANT
BANDAGE ACE 3X5.8 VEL STRL LF (GAUZE/BANDAGES/DRESSINGS) ×1 IMPLANT
BANDAGE ACE 4X5 VEL STRL LF (GAUZE/BANDAGES/DRESSINGS) ×2 IMPLANT
BIT DRILL CANN 2.4 (BIT) ×2
BIT DRILL CANN MAX VPC 2.4 (BIT) IMPLANT
BNDG CMPR 9X4 STRL LF SNTH (GAUZE/BANDAGES/DRESSINGS) ×1
BNDG COHESIVE 4X5 TAN STRL (GAUZE/BANDAGES/DRESSINGS) ×2 IMPLANT
BNDG ESMARK 4X9 LF (GAUZE/BANDAGES/DRESSINGS) ×2 IMPLANT
BNDG GAUZE ELAST 4 BULKY (GAUZE/BANDAGES/DRESSINGS) ×2 IMPLANT
CORDS BIPOLAR (ELECTRODE) ×2 IMPLANT
COVER MAYO STAND STRL (DRAPES) ×2 IMPLANT
COVER SURGICAL LIGHT HANDLE (MISCELLANEOUS) ×2 IMPLANT
CUFF TOURNIQUET SINGLE 18IN (TOURNIQUET CUFF) ×2 IMPLANT
DRAPE OEC MINIVIEW 54X84 (DRAPES) ×1 IMPLANT
DRAPE ORTHO SPLIT 77X108 STRL (DRAPES) ×4
DRAPE SURG ORHT 6 SPLT 77X108 (DRAPES) ×2 IMPLANT
DRAPE U-SHAPE 47X51 STRL (DRAPES) ×2 IMPLANT
DRSG ADAPTIC 3X8 NADH LF (GAUZE/BANDAGES/DRESSINGS) ×1 IMPLANT
GAUZE SPONGE 4X4 12PLY STRL (GAUZE/BANDAGES/DRESSINGS) ×1 IMPLANT
GAUZE XEROFORM 5X9 LF (GAUZE/BANDAGES/DRESSINGS) ×2 IMPLANT
GLOVE BIOGEL PI IND STRL 8.5 (GLOVE) ×1 IMPLANT
GLOVE BIOGEL PI INDICATOR 8.5 (GLOVE) ×1
GLOVE SURG ORTHO 8.0 STRL STRW (GLOVE) ×2 IMPLANT
GOWN STRL REUS W/ TWL LRG LVL3 (GOWN DISPOSABLE) ×2 IMPLANT
GOWN STRL REUS W/ TWL XL LVL3 (GOWN DISPOSABLE) ×1 IMPLANT
GOWN STRL REUS W/TWL LRG LVL3 (GOWN DISPOSABLE) ×4
GOWN STRL REUS W/TWL XL LVL3 (GOWN DISPOSABLE) ×2
K-WIRE COCR 1.1X105 (WIRE) ×6
KIT BASIN OR (CUSTOM PROCEDURE TRAY) ×2 IMPLANT
KIT TURNOVER KIT B (KITS) ×2 IMPLANT
KWIRE COCR 1.1X105 (WIRE) IMPLANT
MANIFOLD NEPTUNE II (INSTRUMENTS) ×2 IMPLANT
NDL HYPO 25GX1X1/2 BEV (NEEDLE) IMPLANT
NEEDLE HYPO 25GX1X1/2 BEV (NEEDLE) IMPLANT
NS IRRIG 1000ML POUR BTL (IV SOLUTION) ×2 IMPLANT
PACK ORTHO EXTREMITY (CUSTOM PROCEDURE TRAY) ×2 IMPLANT
PAD ARMBOARD 7.5X6 YLW CONV (MISCELLANEOUS) ×4 IMPLANT
PAD CAST 4YDX4 CTTN HI CHSV (CAST SUPPLIES) IMPLANT
PADDING CAST COTTON 4X4 STRL (CAST SUPPLIES) ×4
SCREW VPC 3.4X16 (Screw) ×2 IMPLANT
SOAP 2 % CHG 4 OZ (WOUND CARE) ×2 IMPLANT
SUCTION FRAZIER HANDLE 10FR (MISCELLANEOUS) ×1
SUCTION TUBE FRAZIER 10FR DISP (MISCELLANEOUS) IMPLANT
SUT PROLENE 3 0 PS 2 (SUTURE) IMPLANT
SUT PROLENE 4 0 PS 2 18 (SUTURE) ×2 IMPLANT
SUT VIC AB 2-0 CT1 27 (SUTURE) ×4
SUT VIC AB 2-0 CT1 TAPERPNT 27 (SUTURE) IMPLANT
SUT VIC AB 4-0 PS2 18 (SUTURE) ×2 IMPLANT
SUT VICRYL 4-0 PS2 18IN ABS (SUTURE) IMPLANT
SYR CONTROL 10ML LL (SYRINGE) IMPLANT
TOWEL OR 17X26 10 PK STRL BLUE (TOWEL DISPOSABLE) ×3 IMPLANT
TUBE CONNECTING 12X1/4 (SUCTIONS) IMPLANT
UNDERPAD 30X30 (UNDERPADS AND DIAPERS) ×2 IMPLANT

## 2018-07-20 NOTE — Progress Notes (Signed)
Orthopedic Tech Progress Note Patient Details:  [name] [birth year] [id number]  Ortho Devices Type of Ortho Device: Arm sling Ortho Device/Splint Location: lue Ortho Device/Splint Interventions: Application   Post Interventions Patient Tolerated: Well Instructions Provided: Care of device   Hildred Priest 07/20/2018, 6:01 PM

## 2018-07-20 NOTE — Op Note (Signed)
PREOPERATIVE DIAGNOSIS:left elbow type II radial head fracture Left elbow ulnar collateral ligament lateral ulnar collateral ligament  POSTOPERATIVE DIAGNOSIS:same  ATTENDING SURGEON:Dr. Iran Planas who was scrubbed and present for the entire procedure  ASSISTANT SURGEON:none  ANESTHESIA:regional block with IV sedation  OPERATIVE PROCEDURE: #1: Left elbow open reduction internal fixation displaced type II radial head fracture #2: Left elbow open repair lateral ulnar collateral ligament complex #3: Left elbow radiographs 3 views  IMPLANTS:2 Biomet headless compression screws 2.4 mm  RADIOGRAPHIC INTERPRETATION:AP lateral oblique views the elbow do show the internal fixation place in good position with good alignment of the radial capitellum joint  SURGICAL INDICATIONS:patient is a right-hand-dominant gentleman who had sustained a closed injury to his left elbow. Patient seen and evaluated in the office and recommended undergo the above procedure. Risks of surgery include but not limited to bleeding infection damage to nearby nerves arteries or tendons loss of motion of the wrists and digits incomplete relief of symptoms and need for further surgical intervention nonunion malunion hardware failure persistent instability and pain popping and need for further surgical intervention  [place] is properly identified in the preoperative holding area marked with a permanent marker made on the left elbow to indicate correct operative site. Patient then brought back to operating room placed supine on anesthesia and table where the regional anesthetic had been administered. Preoperative antibiotics were given prior to any skin incision. A well-padded tourniquet was then placed on the left brachium and sealed with the appropriate drape. The left upper extremities and prepped and draped in normal sterile fashion. A timeout was called the correct site was identified and the procedure then  begun. A lateral approach was made to the outside portion of the elbow. Dissection carried down through the skin and subcutaneous tissue. The extensor interval was then incised longitudinally exposing the joint capsule. Joint capsulotomy carried out and the large effusion was then evacuated. The joint was then opened and the type II radial head fracture was then identified. Takedown of the early fracture callus was then carried out along the rim of the radial head and the radial head was then carefully elevated. Blunt retractors were then placed around the radial head. Following this 2 guidewires were then placed in parallel fashion 90 to the fracture site open reduction was then performed. Following this the 2 guidewires were then confirmed using the mini C-arm. After adequate placement a were drilled with the appropriate drill bit and the 2.4 mm compression screws were then placed both 16 mm in length. These were seated nicely to avoid any type of irritation at the radial ulnar joint. The wound was then thoroughly irrigated. The elbow was placed through good range of motion. Following this the lateral collateral ligament complex was then repaired. 2 Biomet rigid 1.45 juggernaut anchors are then placed. The lateral collateral ligament was then repaired down nicely down to the bone. The remaining extensor interval and capsule was then closed with a other juggernaut anteriorly. Remaining extensor interval was then closed with 2-0 Vicryl. The subcutaneous tissues were then closed with Vicryl. The skin was then closed using simple Prolene suture. Final radiographs were then obtained. Adaptic dressing a sterile compressive bandage then applied. The patient is then placed in well-padded long-arm splint and taken recovery room in good condition.  POSTOPERATIVE PLAN:patient be discharged to home. Seen back now office in 2 weeks for wound check x-rays application of a long-arm posterior splint and begin a postoperative  radial head protocol ORIF. Radiographs  at each visit. 3 views of the radial head.

## 2018-07-20 NOTE — Transfer of Care (Signed)
Immediate Anesthesia Transfer of Care Note  Patient: [name]  Procedure(s) Performed: LEFT ELBOW OPEN REDUCTION INTERNAL FIXATION (ORIF) AND REPAIR AS INDICATED, LIGAMENT AND BONE (Left Elbow)  Patient Location: PACU  Anesthesia Type:MAC combined with regional for post-op pain  Level of Consciousness: awake, alert  and oriented  Airway & Oxygen Therapy: Patient Spontanous Breathing  Post-op Assessment: Report given to RN, Post -op Vital signs reviewed and stable and Patient moving all extremities X 4  Post vital signs: Reviewed and stable  Last Vitals:  Vitals Value Taken Time  BP 132/65 07/20/2018  5:35 PM  Temp 36.6 C 07/20/2018  5:33 PM  Pulse 72 07/20/2018  5:37 PM  Resp 27 07/20/2018  5:37 PM  SpO2 97 % 07/20/2018  5:37 PM  Vitals shown include unvalidated device data.  Last Pain:  Vitals:   07/20/18 1733  TempSrc:   PainSc: 0-No pain      Patients Stated Pain Goal: 9 (49/82/64 1583)  Complications: No apparent anesthesia complications

## 2018-07-20 NOTE — Discharge Instructions (Signed)
[facility] CALL OFFICE FOR F/U APPT [phone] IN 13 DAYS RX SENT TO [facility] [address] [place] KEEP HAND ELEVATED ABOVE HEART OK TO APPLY ICE TO OPERATIVE AREA CONTACT OFFICE IF ANY WORSENING PAIN OR CONCERNS.

## 2018-07-20 NOTE — Progress Notes (Signed)
TRANSPORT HOME WILL BE [place]    [place]   [phone]

## 2018-07-20 NOTE — Anesthesia Postprocedure Evaluation (Signed)
Anesthesia Post Note  Patient: [name]  Procedure(s) Performed: LEFT ELBOW OPEN REDUCTION INTERNAL FIXATION (ORIF) AND REPAIR AS INDICATED, LIGAMENT AND BONE (Left Elbow)     Patient location during evaluation: PACU Anesthesia Type: Regional Level of consciousness: awake Pain management: pain level controlled Vital Signs Assessment: post-procedure vital signs reviewed and stable Respiratory status: spontaneous breathing Cardiovascular status: stable Postop Assessment: no apparent nausea or vomiting Anesthetic complications: no    Last Vitals:  Vitals:   07/20/18 1537 07/20/18 1733  BP:  132/65  Pulse: 72   Resp:    Temp:  36.6 C  SpO2: 97%     Last Pain:  Vitals:   07/20/18 1733  TempSrc:   PainSc: 0-No pain                 Harjot Dibello

## 2018-07-20 NOTE — Anesthesia Preprocedure Evaluation (Addendum)
Anesthesia Evaluation  Patient identified by MRN, date of birth, ID band Patient awake    Reviewed: Allergy & Precautions, NPO status , Patient's Chart, lab work & pertinent test results  History of Anesthesia Complications Negative for: history of anesthetic complications  Airway Mallampati: III  TM Distance: >3 FB Neck ROM: Full    Dental  (+) Teeth Intact   Pulmonary shortness of breath (has had some mild increased shortness of breath which he attributes to gaining weight; still able to walk one flight of stairs without catching his breath; no associated chest pain; had negative stress test 4 years ago (per patient report)) and with exertion, sleep apnea ,    Pulmonary exam normal        Cardiovascular hypertension (currently on no meds), negative cardio ROS Normal cardiovascular exam     Neuro/Psych negative neurological ROS  negative psych ROS   GI/Hepatic Neg liver ROS, GERD  ,  Endo/Other  Morbid obesity  Renal/GU negative Renal ROS  negative genitourinary   Musculoskeletal negative musculoskeletal ROS (+)   Abdominal (+) + obese,   Peds  Hematology negative hematology ROS (+)   Anesthesia Other Findings   Reproductive/Obstetrics negative OB ROS                             Anesthesia Physical Anesthesia Plan  ASA: III  Anesthesia Plan: Regional   Post-op Pain Management:  Regional for Post-op pain   Induction:   PONV Risk Score and Plan: 1 and Propofol infusion  Airway Management Planned: Natural Airway and Nasal Cannula  Additional Equipment:   Intra-op Plan:   Post-operative Plan:   Informed Consent: I have reviewed the patients History and Physical, chart, labs and discussed the procedure including the risks, benefits and alternatives for the proposed anesthesia with the patient or authorized representative who has indicated his/her understanding and acceptance.    Dental advisory given  Plan Discussed with:   Anesthesia Plan Comments:         Anesthesia Quick Evaluation

## 2018-07-20 NOTE — Anesthesia Procedure Notes (Signed)
Anesthesia Regional Block: Supraclavicular block   Pre-Anesthetic Checklist: ,, timeout performed, Correct Patient, Correct Site, Correct Laterality, Correct Procedure, Correct Position, site marked, Risks and benefits discussed,  Surgical consent,  Pre-op evaluation,  At surgeon's request and post-op pain management  Laterality: Left  Prep: chloraprep       Needles:  Injection technique: Single-shot  Needle Type: Echogenic Stimulator Needle     Needle Length: 9cm  Needle Gauge: 21     Additional Needles:   Procedures:,,,, ultrasound used (permanent image in chart),,,,  Narrative:  Start time: 07/20/2018 3:10 PM End time: 07/20/2018 3:16 PM Injection made incrementally with aspirations every 5 mL.  Performed by: Personally  Anesthesiologist: Lidia Collum, MD

## 2018-07-20 NOTE — Anesthesia Procedure Notes (Signed)
Procedure Name: MAC Date/Time: 07/20/2018 4:00 PM Performed by: Suzy Bouchard, CRNA Pre-anesthesia Checklist: Patient identified, Emergency Drugs available, Suction available, Timeout performed and Patient being monitored Patient Re-evaluated:Patient Re-evaluated prior to induction Oxygen Delivery Method: Simple face mask

## 2018-07-23 ENCOUNTER — Encounter (HOSPITAL_COMMUNITY): Payer: Self-pay | Admitting: Orthopedic Surgery

## 2018-08-02 DIAGNOSIS — S52125D Nondisplaced fracture of head of left radius, subsequent encounter for closed fracture with routine healing: Secondary | ICD-10-CM | POA: Diagnosis not present

## 2018-09-04 DIAGNOSIS — S52125D Nondisplaced fracture of head of left radius, subsequent encounter for closed fracture with routine healing: Secondary | ICD-10-CM | POA: Diagnosis not present

## 2018-09-12 DIAGNOSIS — M25522 Pain in left elbow: Secondary | ICD-10-CM | POA: Diagnosis not present

## 2018-10-01 DIAGNOSIS — H524 Presbyopia: Secondary | ICD-10-CM | POA: Diagnosis not present

## 2018-11-28 ENCOUNTER — Encounter: Payer: Self-pay | Admitting: Family

## 2018-11-28 ENCOUNTER — Ambulatory Visit: Payer: BLUE CROSS/BLUE SHIELD | Admitting: Family

## 2018-11-28 VITALS — BP 159/95 | HR 71 | Temp 98.3°F | Resp 16 | Wt 286.0 lb

## 2018-11-28 DIAGNOSIS — J019 Acute sinusitis, unspecified: Secondary | ICD-10-CM

## 2018-11-28 DIAGNOSIS — R030 Elevated blood-pressure reading, without diagnosis of hypertension: Secondary | ICD-10-CM

## 2018-11-28 MED ORDER — AMOXICILLIN-POT CLAVULANATE 875-125 MG PO TABS: 1.0000 | ORAL_TABLET | Freq: Two times a day (BID) | ORAL | 0 refills | Status: DC

## 2018-11-28 NOTE — Progress Notes (Signed)
Subjective:    Patient ID: [name], male    DOB: Jan 05, 1951, 68 y.o.   MRN: [id number]  HPI  Patient is a 68 yr old male who presents today with chief complaint of nasal congestion. Also has associated upper chest congestion.  Reports occasional low grade fever.  + fatigue, no HA or Body aches.  Started 10 days ago. Has tried zinc lozenges and corididin HBP without much improvement.  Reports thick yellow/green/brown nasal discharge which is dripping down the back of his throat and causing throat irritation.   Elevated blood pressure reading-  BP Readings from Last 3 Encounters:  11/28/18 (!) 159/95  07/20/18 132/72  07/20/17 126/76     Review of Systems See HPI  Past Medical History:  Diagnosis Date  . Back pain    chronic  . GERD (gastroesophageal reflux disease)   . Obesity   . OSA (obstructive sleep apnea)      Social History   Socioeconomic History  . Marital status: Single    Spouse name: Not on file  . Number of children: Not on file  . Years of education: Not on file  . Highest education level: Not on file  Occupational History  . Not on file  Social Needs  . Financial resource strain: Not on file  . Food insecurity:    Worry: Not on file    Inability: Not on file  . Transportation needs:    Medical: Not on file    Non-medical: Not on file  Tobacco Use  . Smoking status: Never Smoker  . Smokeless tobacco: Never Used  Substance and Sexual Activity  . Alcohol use: Yes  . Drug use: No  . Sexual activity: Not on file  Lifestyle  . Physical activity:    Days per week: Not on file    Minutes per session: Not on file  . Stress: Not on file  Relationships  . Social connections:    Talks on phone: Not on file    Gets together: Not on file    Attends religious service: Not on file    Active member of club or organization: Not on file    Attends meetings of clubs or organizations: Not on file    Relationship status: Not on file  . Intimate  partner violence:    Fear of current or ex partner: Not on file    Emotionally abused: Not on file    Physically abused: Not on file    Forced sexual activity: Not on file  Other Topics Concern  . Not on file  Social History Narrative  . Not on file    Past Surgical History:  Procedure Laterality Date  . HEMANGIOMA EXCISION  1990   left leg  . HERNIA REPAIR    . KNEE SURGERY Right 2015  . ORIF RADIAL FRACTURE Left 07/20/2018   Procedure: LEFT ELBOW OPEN REDUCTION INTERNAL FIXATION (ORIF) AND REPAIR AS INDICATED, LIGAMENT AND BONE;  Surgeon: Iran Planas, MD;  Location: [facility];  Service: Orthopedics;  Laterality: Left;    Family History  Problem Relation Age of Onset  . Arthritis Mother        spine  . Heart disease Mother   . Hypertension Mother   . Cancer Mother        bone  . Heart disease Brother        murmur    Allergies  Allergen Reactions  . Other Other (See Comments)  Anesthetic "pentathol" caused hallucinations over 48 hrs  . Latex     UNSPECIFIED REACTION   . Mango Flavor [Flavoring Agent] Rash and Other (See Comments)    Mango tree sap    No current outpatient medications on file prior to visit.   No current facility-administered medications on file prior to visit.     BP (!) 159/95 (BP Location: Left Arm, Patient Position: Sitting, Cuff Size: Large)   Pulse 71   Temp 98.3 F (36.8 C) (Oral)   Resp 16   Wt 286 lb (129.7 kg)   SpO2 98%   BMI 40.46 kg/m       Objective:   Physical Exam Constitutional:      General: He is not in acute distress.    Appearance: He is well-developed.  HENT:     Head: Normocephalic and atraumatic.     Right Ear: Tympanic membrane and ear canal normal.     Left Ear: Tympanic membrane and ear canal normal.     Nose:     Right Sinus: No maxillary sinus tenderness or frontal sinus tenderness.     Left Sinus: No maxillary sinus tenderness or frontal sinus tenderness.     Mouth/Throat:     Mouth: Mucous  membranes are moist.  Cardiovascular:     Rate and Rhythm: Normal rate and regular rhythm.     Heart sounds: No murmur.  Pulmonary:     Effort: Pulmonary effort is normal. No respiratory distress.     Breath sounds: Normal breath sounds. No wheezing or rales.  Skin:    General: Skin is warm and dry.  Neurological:     Mental Status: He is alert and oriented to person, place, and time.  Psychiatric:        Behavior: Behavior normal.        Thought Content: Thought content normal.           Assessment & Plan:  Sinusitis- advised pt as follows:  Please begin augmentin for sinus infection. Continue coricidin HBP as needed for congestion. You may also add flonase 2 sprays each nostril once daily to help with sinus and ear congestion.  Elevated blood pressure reading- discussed low sodium diet. Plan to recheck in 1 month at [facility] and if still elevated initiate treatment.

## 2018-11-28 NOTE — Patient Instructions (Signed)
Please begin augmentin for sinus infection. Continue coricidin HBP as needed for congestion. You may also add flonase 2 sprays each nostril once daily to help with sinus and ear congestion. Call if symptoms worsen or if not improved in 3-4 days.

## 2019-03-15 DIAGNOSIS — I517 Cardiomegaly: Secondary | ICD-10-CM | POA: Diagnosis not present

## 2019-03-15 DIAGNOSIS — Z8679 Personal history of other diseases of the circulatory system: Secondary | ICD-10-CM | POA: Diagnosis not present

## 2019-03-15 DIAGNOSIS — Z20828 Contact with and (suspected) exposure to other viral communicable diseases: Secondary | ICD-10-CM | POA: Diagnosis not present

## 2019-03-15 DIAGNOSIS — R439 Unspecified disturbances of smell and taste: Secondary | ICD-10-CM | POA: Diagnosis not present

## 2019-07-02 DIAGNOSIS — J029 Acute pharyngitis, unspecified: Secondary | ICD-10-CM | POA: Diagnosis not present

## 2019-07-03 DIAGNOSIS — R439 Unspecified disturbances of smell and taste: Secondary | ICD-10-CM | POA: Diagnosis not present

## 2019-07-03 DIAGNOSIS — J029 Acute pharyngitis, unspecified: Secondary | ICD-10-CM | POA: Diagnosis not present

## 2019-07-03 DIAGNOSIS — Z20828 Contact with and (suspected) exposure to other viral communicable diseases: Secondary | ICD-10-CM | POA: Diagnosis not present

## 2019-12-18 DIAGNOSIS — Z20822 Contact with and (suspected) exposure to covid-19: Secondary | ICD-10-CM | POA: Diagnosis not present

## 2019-12-26 DIAGNOSIS — Z23 Encounter for immunization: Secondary | ICD-10-CM | POA: Diagnosis not present

## 2020-01-16 DIAGNOSIS — Z23 Encounter for immunization: Secondary | ICD-10-CM | POA: Diagnosis not present

## 2020-07-09 DIAGNOSIS — Z20822 Contact with and (suspected) exposure to covid-19: Secondary | ICD-10-CM | POA: Diagnosis not present

## 2020-08-17 DIAGNOSIS — H5213 Myopia, bilateral: Secondary | ICD-10-CM | POA: Diagnosis not present

## 2020-08-17 LAB — HM DIABETES EYE EXAM

## 2020-08-19 ENCOUNTER — Other Ambulatory Visit: Payer: Self-pay

## 2020-08-19 ENCOUNTER — Ambulatory Visit (INDEPENDENT_AMBULATORY_CARE_PROVIDER_SITE_OTHER): Payer: BLUE CROSS/BLUE SHIELD | Admitting: Family

## 2020-08-19 VITALS — BP 127/78 | HR 68 | Temp 98.7°F | Resp 16 | Ht 70.5 in | Wt 270.0 lb

## 2020-08-19 DIAGNOSIS — R739 Hyperglycemia, unspecified: Secondary | ICD-10-CM

## 2020-08-19 DIAGNOSIS — Z23 Encounter for immunization: Secondary | ICD-10-CM | POA: Diagnosis not present

## 2020-08-19 DIAGNOSIS — R29898 Other symptoms and signs involving the musculoskeletal system: Secondary | ICD-10-CM | POA: Diagnosis not present

## 2020-08-19 DIAGNOSIS — I10 Essential (primary) hypertension: Secondary | ICD-10-CM

## 2020-08-19 DIAGNOSIS — G1220 Motor neuron disease, unspecified: Secondary | ICD-10-CM

## 2020-08-19 DIAGNOSIS — R531 Weakness: Secondary | ICD-10-CM

## 2020-08-19 LAB — CBC WITH DIFFERENTIAL/PLATELET
Eosinophils Absolute: 231 cells/uL (ref 15–500)
Lymphs Abs: 1918 cells/uL (ref 850–3900)

## 2020-08-19 NOTE — Progress Notes (Signed)
Subjective:    Patient ID: [name], male    DOB: April 16, 1951, 69 y.o.   MRN: [id number]  HPI  Patient is a 69 yr old male who presents today with concern about intermittent LE weakness.   Reports increased stress as his boss passed away.  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 Reports that 2 weeks ago he was walking down the hall to his bedroom.  He felt lightheaded, then his legs began to shake and give way.  He caught himself on the door jam then sat down and was "fine."  A few days later he stood up and was headed to the kitchen counter and legs began to shake. He had to hold onto the kitchen counter to hold himself up.  Reports that he had access to a home bp cuff.  Reports sbp 160-180 /78-81.  Had another leg shaking incident. Did not fall down.  Yesterday- stood up quickly, light headedness, shaking so bad that things were shaking on the table. He lowered himself to his knees, then down to the ground. He then got himself into the chair.  Reports that he felt better.  Then he climbed the stairs without weakness in his legs whatsover.  Light headeness last night.   He has not lost consciousness during any of these events.  Shaking is "just the legs."Feels like it is even.  Leg strength is normal. Denies loss of bowel or bladder.  Wt Readings from Last 3 Encounters:  08/19/20 270 lb (122.5 kg)  11/28/18 286 lb (129.7 kg)  07/20/18 280 lb (127 kg)   Thinks that he might have been a bit dehydrated in the original episodes.  He is trying to wean himself off of coke/caffeine and is trying to switch to water.   He reports low back pain that occurred 1 year ago when he fell down the stairs.  Back pain is no worse than his baseline. He notes that these episodes of LE weakness tend to occur most often when he stands from a seated position.    Review of Systems   See HPI  Past Medical History:  Diagnosis Date  . Back pain     chronic  . GERD (gastroesophageal reflux disease)   . Obesity   . OSA (obstructive sleep apnea)      Social History   Socioeconomic History  . Marital status: Single    Spouse name: Not on file  . Number of children: Not on file  . Years of education: Not on file  . Highest education level: Not on file  Occupational History  . Not on file  Tobacco Use  . Smoking status: Never Smoker  . Smokeless tobacco: Never Used  Substance and Sexual Activity  . Alcohol use: Yes  . Drug use: No  . Sexual activity: Not on file  Other Topics Concern  . Not on file  Social History Narrative  . Not on file   Social Determinants of Health   Financial Resource Strain:   . Difficulty of Paying Living Expenses: Not on file  Food Insecurity:   . Worried About Charity fundraiser in the Last Year: Not on file  . Ran Out of Food in the Last Year: Not on file  Transportation Needs:   . Lack of Transportation (Medical): Not on file  . Lack of Transportation (Non-Medical): Not on file  Physical Activity:   . Days  of Exercise per Week: Not on file  . Minutes of Exercise per Session: Not on file  Stress:   . Feeling of Stress : Not on file  Social Connections:   . Frequency of Communication with Friends and Family: Not on file  . Frequency of Social Gatherings with Friends and Family: Not on file  . Attends Religious Services: Not on file  . Active Member of Clubs or Organizations: Not on file  . Attends Archivist Meetings: Not on file  . Marital Status: Not on file  Intimate Partner Violence:   . Fear of Current or Ex-Partner: Not on file  . Emotionally Abused: Not on file  . Physically Abused: Not on file  . Sexually Abused: Not on file    Past Surgical History:  Procedure Laterality Date  . HEMANGIOMA EXCISION  1990   left leg  . HERNIA REPAIR    . KNEE SURGERY Right 2015  . ORIF RADIAL FRACTURE Left 07/20/2018   Procedure: LEFT ELBOW OPEN REDUCTION INTERNAL FIXATION  (ORIF) AND REPAIR AS INDICATED, LIGAMENT AND BONE;  Surgeon: Iran Planas, MD;  Location: [facility];  Service: Orthopedics;  Laterality: Left;    Family History  Problem Relation Age of Onset  . Arthritis Mother        spine  . Heart disease Mother   . Hypertension Mother   . Cancer Mother        bone  . Heart disease Brother        murmur    Allergies  Allergen Reactions  . Other Other (See Comments)    Anesthetic "pentathol" caused hallucinations over 48 hrs  . Latex     UNSPECIFIED REACTION   . Mango Flavor [Flavoring Agent] Rash and Other (See Comments)    Mango tree sap    No current outpatient medications on file prior to visit.   No current facility-administered medications on file prior to visit.    BP (!) 146/59 (BP Location: Right Arm, Patient Position: Sitting, Cuff Size: Large)   Pulse 61   Temp 98.7 F (37.1 C) (Oral)   Resp 16   Ht 5' 10.5" (1.791 m)   Wt 270 lb (122.5 kg)   SpO2 98%   BMI 38.19 kg/m       Objective:   Physical Exam Constitutional:      General: He is not in acute distress.    Appearance: He is well-developed.  HENT:     Head: Normocephalic and atraumatic.  Eyes:     Extraocular Movements: Extraocular movements intact.     Pupils: Pupils are equal, round, and reactive to light.  Cardiovascular:     Rate and Rhythm: Normal rate and regular rhythm.     Heart sounds: No murmur heard.   Pulmonary:     Effort: Pulmonary effort is normal. No respiratory distress.     Breath sounds: Normal breath sounds. No wheezing or rales.  Skin:    General: Skin is warm and dry.  Neurological:     Mental Status: He is alert and oriented to person, place, and time.     Cranial Nerves: Cranial nerves are intact.     Motor: Motor function is intact. No weakness or tremor.     Deep Tendon Reflexes:     Reflex Scores:      Patellar reflexes are 3+ on the right side and 3+ on the left side. Psychiatric:        Behavior: Behavior normal.  Thought Content: Thought content normal.           Assessment & Plan:  Intermittent bilateral LE weakness-  Etiology unclear.  Normal neuro exam today with the exception of 3+ bilateral patellar reflexes.  I would like to obtain an MRI of the lumbar spine to rule out spinal pathology leading to his intermittent LE weakness.  The shaking nature also raises the possibility of seizure activity.  Will also plan referral to neurology.  Hyperglycemia-  New. glucose noted to be 199.  I have asked the lab to add on an A1C.  HTN- variable BP per pt report.  Looks OK today. Not on medication.  Will continue to monitor.  BP Readings from Last 3 Encounters:  08/19/20 127/78  11/28/18 (!) 159/95  07/20/18 132/72   This visit occurred during the SARS-CoV-2 public health emergency.  Safety protocols were in place, including screening questions prior to the visit, additional usage of staff PPE, and extensive cleaning of exam room while observing appropriate contact time as indicated for disinfecting solutions.

## 2020-08-19 NOTE — Patient Instructions (Signed)
Please complete lab work prior to leaving. You should be contacted about scheduling your appointment with the neurologist.  Be sure to eat a balanced diet, drink plenty of fluids and get adequate rest. Please go to the ER if severe/worsening symptoms.

## 2020-08-20 ENCOUNTER — Encounter: Payer: Self-pay | Admitting: Family

## 2020-08-20 ENCOUNTER — Telehealth: Payer: Self-pay | Admitting: Family

## 2020-08-20 DIAGNOSIS — R739 Hyperglycemia, unspecified: Secondary | ICD-10-CM | POA: Insufficient documentation

## 2020-08-20 HISTORY — DX: Hyperglycemia, unspecified: R73.9

## 2020-08-20 LAB — CBC WITH DIFFERENTIAL/PLATELET: Basophils Absolute: 63 cells/uL (ref 0–200)

## 2020-08-20 LAB — COMPREHENSIVE METABOLIC PANEL
BUN: 19 mg/dL (ref 7–25)
Chloride: 98 mmol/L (ref 98–110)

## 2020-08-20 NOTE — Telephone Encounter (Signed)
Please advise pt that I reviewed his lab work.  His kidney function, thyroid and blood count look good.  His sugar is elevated in the diabetes range.   I don't think that his sugar is the cause for his leg weakness.  However, I would like him to work on limiting carbs, sugars in his diet as well as weight loss.   I would like to see him back in 1 month for follow up.

## 2020-08-20 NOTE — Telephone Encounter (Signed)
Patient advised of results and provider's advise. He did not want to schedule follow up at this time he said he "will call back to schedule"

## 2020-08-21 ENCOUNTER — Telehealth: Payer: Self-pay | Admitting: Family

## 2020-08-21 LAB — TEST AUTHORIZATION

## 2020-08-21 LAB — CBC WITH DIFFERENTIAL/PLATELET
Absolute Monocytes: 455 cells/uL (ref 200–950)
Basophils Relative: 0.9 %
Eosinophils Relative: 3.3 %
HCT: 47.5 % (ref 38.5–50.0)
Hemoglobin: 16.1 g/dL (ref 13.2–17.1)
MCH: 29 pg (ref 27.0–33.0)
MCHC: 33.9 g/dL (ref 32.0–36.0)
MCV: 85.6 fL (ref 80.0–100.0)
MPV: 11 fL (ref 7.5–12.5)
Monocytes Relative: 6.5 %
Neutro Abs: 4333 cells/uL (ref 1500–7800)
Neutrophils Relative %: 61.9 %
Platelets: 151 10*3/uL (ref 140–400)
RBC: 5.55 10*6/uL (ref 4.20–5.80)
RDW: 13.4 % (ref 11.0–15.0)
Total Lymphocyte: 27.4 %
WBC: 7 10*3/uL (ref 3.8–10.8)

## 2020-08-21 LAB — COMPREHENSIVE METABOLIC PANEL
AG Ratio: 1.5 (calc) (ref 1.0–2.5)
ALT: 20 U/L (ref 9–46)
AST: 21 U/L (ref 10–35)
Albumin: 4.4 g/dL (ref 3.6–5.1)
Alkaline phosphatase (APISO): 75 U/L (ref 35–144)
CO2: 25 mmol/L (ref 20–32)
Calcium: 9.3 mg/dL (ref 8.6–10.3)
Creat: 1.01 mg/dL (ref 0.70–1.25)
Globulin: 3 g/dL (calc) (ref 1.9–3.7)
Glucose, Bld: 199 mg/dL — ABNORMAL HIGH (ref 65–99)
Potassium: 4.6 mmol/L (ref 3.5–5.3)
Sodium: 135 mmol/L (ref 135–146)
Total Bilirubin: 0.9 mg/dL (ref 0.2–1.2)
Total Protein: 7.4 g/dL (ref 6.1–8.1)

## 2020-08-21 LAB — HEMOGLOBIN A1C W/OUT EAG: Hgb A1c MFr Bld: 8.6 % of total Hgb — ABNORMAL HIGH (ref <5.7)

## 2020-08-21 LAB — TSH: TSH: 1.87 mIU/L (ref 0.40–4.50)

## 2020-08-21 MED ORDER — METFORMIN HCL 500 MG PO TABS: 500.0000 mg | ORAL_TABLET | Freq: Two times a day (BID) | ORAL | 0 refills | Status: DC

## 2020-08-21 NOTE — Telephone Encounter (Signed)
Patient advised of results, new prescription  and provider's advise. He verbalized understanding. He said he "will call back to schedule follow up"

## 2020-08-21 NOTE — Telephone Encounter (Signed)
Please let pt know that I had the lab add on an A1C to further evaluate his sugar and it was 8.6.  Goal for diabetes is <7.0. In addition to diet, exercise and weight loss, I would recommend that he also add metformin 500mg  bid.

## 2020-08-24 ENCOUNTER — Telehealth: Payer: Self-pay | Admitting: Family

## 2020-08-24 DIAGNOSIS — E1165 Type 2 diabetes mellitus with hyperglycemia: Secondary | ICD-10-CM

## 2020-08-24 NOTE — Telephone Encounter (Signed)
Patient would like a diabetic specialist referral.  Patient states he would like a prescription for a diabetic monitor.   [id number] [place], [place] [address]  [address] [place] [address]  Phone:  [phone] Fax:  [phone]

## 2020-08-25 MED ORDER — BLOOD GLUCOSE MONITOR KIT: PACK | 0 refills | Status: DC

## 2020-08-25 NOTE — Telephone Encounter (Signed)
See printed glucometer Rx. Please fax to pharmacy and notify pt. Endo referral has been placed.

## 2020-08-25 NOTE — Telephone Encounter (Signed)
rx faxed to pharmacy

## 2020-08-27 NOTE — Telephone Encounter (Signed)
Patient would like to talk to someone in regards to this matter. Patient states he will be calling every two hours till he hear from someone.

## 2020-08-27 NOTE — Telephone Encounter (Signed)
Patient stated that he started metformin on Sunday and his blood sugars at that time was in the 200s.  He has now changed his eating habits 3 small meals a day and 3 small snacks.  He has been checking glucose before meals in the morning and before dinner and now his sugars are running in the 130s.  He wants to know if numbers should go down that much?  Also what is the proper response if sugar go to 90-110?  I advised patient that we have referral in for specialist.  I asked would he be interested in seeing nutritionist and he declined.

## 2020-08-28 MED ORDER — FREESTYLE LIBRE 2 SENSOR MISC: 5 refills | Status: DC

## 2020-08-28 MED ORDER — FREESTYLE LIBRE 2 READER DEVI: 0 refills | Status: DC

## 2020-08-28 NOTE — Telephone Encounter (Signed)
Provider comments about glucose readings given to patient. He was advised rx was sent in to the pharmacy for him

## 2020-08-28 NOTE — Telephone Encounter (Signed)
Please advise pt that 90-110 is perfect.  Let me know if he has lows <70.

## 2020-08-28 NOTE — Addendum Note (Signed)
Addended by: Debbrah Alar on: 08/28/2020 04:18 PM   Modules accepted: Orders

## 2020-08-28 NOTE — Telephone Encounter (Signed)
Rx sent to [facility]

## 2020-08-28 NOTE — Telephone Encounter (Signed)
Patient states he wants the FreeStyle libre 2 with patches. (means patient doesn't have to lancet fingers)

## 2020-08-29 ENCOUNTER — Ambulatory Visit (HOSPITAL_BASED_OUTPATIENT_CLINIC_OR_DEPARTMENT_OTHER)
Admission: RE | Admit: 2020-08-29 | Discharge: 2020-08-29 | Disposition: A | Discharge status: Home / Self Care | Payer: BLUE CROSS/BLUE SHIELD | Source: Ambulatory Visit | Attending: Family | Admitting: Family

## 2020-08-29 ENCOUNTER — Other Ambulatory Visit: Payer: Self-pay

## 2020-08-29 DIAGNOSIS — M5126 Other intervertebral disc displacement, lumbar region: Secondary | ICD-10-CM | POA: Diagnosis not present

## 2020-08-29 DIAGNOSIS — M48061 Spinal stenosis, lumbar region without neurogenic claudication: Secondary | ICD-10-CM | POA: Diagnosis not present

## 2020-08-29 DIAGNOSIS — G1220 Motor neuron disease, unspecified: Secondary | ICD-10-CM | POA: Diagnosis not present

## 2020-08-29 DIAGNOSIS — M47816 Spondylosis without myelopathy or radiculopathy, lumbar region: Secondary | ICD-10-CM | POA: Diagnosis not present

## 2020-08-29 DIAGNOSIS — M5127 Other intervertebral disc displacement, lumbosacral region: Secondary | ICD-10-CM | POA: Diagnosis not present

## 2020-08-31 ENCOUNTER — Ambulatory Visit: Payer: BLUE CROSS/BLUE SHIELD | Admitting: Diagnostic Neuroimaging

## 2020-08-31 ENCOUNTER — Encounter: Payer: Self-pay | Admitting: Diagnostic Neuroimaging

## 2020-08-31 ENCOUNTER — Telehealth: Payer: Self-pay | Admitting: Family

## 2020-08-31 ENCOUNTER — Other Ambulatory Visit: Payer: Self-pay

## 2020-08-31 VITALS — BP 136/77 | HR 84 | Ht 70.5 in | Wt 258.0 lb

## 2020-08-31 DIAGNOSIS — E1149 Type 2 diabetes mellitus with other diabetic neurological complication: Secondary | ICD-10-CM

## 2020-08-31 DIAGNOSIS — E119 Type 2 diabetes mellitus without complications: Secondary | ICD-10-CM

## 2020-08-31 DIAGNOSIS — R29898 Other symptoms and signs involving the musculoskeletal system: Secondary | ICD-10-CM | POA: Diagnosis not present

## 2020-08-31 NOTE — Telephone Encounter (Signed)
Patient would like a referral to [facility] [place]. Patient states they have an available appt but he needs referral. Please call him back when done

## 2020-08-31 NOTE — Patient Instructions (Signed)
LOWER EXTREMITY WEAKNESS (transient) - continue diabetes treatment - continue nutrition and exercise changes

## 2020-08-31 NOTE — Progress Notes (Signed)
[facility]  PATIENT: [name] DOB: 1951/06/16  REFERRING CLINICIAN: Debbrah Alar, NP HISTORY FROM: patient  REASON FOR VISIT: new consult    HISTORICAL  CHIEF COMPLAINT:  Chief Complaint  Patient presents with  . Weakness of BLE    rm 6 New Pt "recently diagnosed with hyoerglycemia so leg issue better"    HISTORY OF PRESENT ILLNESS:   69 year old male here for evaluation of lower extremity weakness.  Patient has had increased stress and work over the past 1 month.  He had several episodes of generalized fatigue, lower extremity weakness, lower extremity shaking, lightheadedness and dizziness.  Symptoms were triggered by moving from sitting to standing position.  Patient also recently diagnosed with diabetes.  Now on Metformin.  A1c was 8.6.  Now patient has improved his diet and exercise.  Symptoms are improving.   REVIEW OF SYSTEMS: Full 14 system review of systems performed and negative with exception of: As per HPI.  ALLERGIES: Allergies  Allergen Reactions  . Other Other (See Comments)    Anesthetic "pentathol" caused hallucinations over 48 hrs  . Latex     UNSPECIFIED REACTION   . Mango Flavor [Flavoring Agent] Rash and Other (See Comments)    Mango tree sap    HOME MEDICATIONS: Outpatient Medications Prior to Visit  Medication Sig Dispense Refill  . blood glucose meter kit and supplies KIT Dispense based on patient and insurance preference. Use up to four times daily as directed. (FOR ICD-9 250.00, 250.01). 1 each 0  . Continuous Blood Gluc Receiver (FREESTYLE LIBRE 2 READER) DEVI Use as directed 1 each 0  . Continuous Blood Gluc Sensor (FREESTYLE LIBRE 2 SENSOR) MISC Use as directed 6 each 5  . metFORMIN (GLUCOPHAGE) 500 MG tablet Take 1 tablet (500 mg total) by mouth 2 (two) times daily with a meal. 180 tablet 0   No facility-administered medications prior to visit.    PAST MEDICAL HISTORY: Past Medical History:  Diagnosis  Date  . Back pain    chronic  . GERD (gastroesophageal reflux disease)   . Hyperglycemia 08/20/2020  . Obesity   . OSA (obstructive sleep apnea)     PAST SURGICAL HISTORY: Past Surgical History:  Procedure Laterality Date  . HEMANGIOMA EXCISION  1990   left leg  . HERNIA REPAIR    . KNEE SURGERY Right 2015  . ORIF RADIAL FRACTURE Left 07/20/2018   Procedure: LEFT ELBOW OPEN REDUCTION INTERNAL FIXATION (ORIF) AND REPAIR AS INDICATED, LIGAMENT AND BONE;  Surgeon: Iran Planas, MD;  Location: [facility];  Service: Orthopedics;  Laterality: Left;    FAMILY HISTORY: Family History  Problem Relation Age of Onset  . Arthritis Mother        spine  . Heart disease Mother   . Hypertension Mother   . Cancer Mother        bone  . Heart disease Brother        murmur    SOCIAL HISTORY: Social History   Socioeconomic History  . Marital status: Single    Spouse name: Not on file  . Number of children: Not on file  . Years of education: Not on file  . Highest education level: Not on file  Occupational History  . Not on file  Tobacco Use  . Smoking status: Never Smoker  . Smokeless tobacco: Never Used  Substance and Sexual Activity  . Alcohol use: Not Currently  . Drug use: No  . Sexual activity: Not on  file  Other Topics Concern  . Not on file  Social History Narrative  . Not on file   Social Determinants of Health   Financial Resource Strain:   . Difficulty of Paying Living Expenses: Not on file  Food Insecurity:   . Worried About Charity fundraiser in the Last Year: Not on file  . Ran Out of Food in the Last Year: Not on file  Transportation Needs:   . Lack of Transportation (Medical): Not on file  . Lack of Transportation (Non-Medical): Not on file  Physical Activity:   . Days of Exercise per Week: Not on file  . Minutes of Exercise per Session: Not on file  Stress:   . Feeling of Stress : Not on file  Social Connections:   . Frequency of Communication with  Friends and Family: Not on file  . Frequency of Social Gatherings with Friends and Family: Not on file  . Attends Religious Services: Not on file  . Active Member of Clubs or Organizations: Not on file  . Attends Archivist Meetings: Not on file  . Marital Status: Not on file  Intimate Partner Violence:   . Fear of Current or Ex-Partner: Not on file  . Emotionally Abused: Not on file  . Physically Abused: Not on file  . Sexually Abused: Not on file     PHYSICAL EXAM  GENERAL EXAM/CONSTITUTIONAL: Vitals:  Vitals:   08/31/20 1014  BP: 136/77  Pulse: 84  Weight: 258 lb (117 kg)  Height: 5' 10.5" (1.791 m)     Body mass index is 36.5 kg/m. Wt Readings from Last 3 Encounters:  08/31/20 258 lb (117 kg)  08/19/20 270 lb (122.5 kg)  11/28/18 286 lb (129.7 kg)     Patient is in no distress; well developed, nourished and groomed; neck is supple  CARDIOVASCULAR:  Examination of carotid arteries is normal; no carotid bruits  Regular rate and rhythm, no murmurs  Examination of peripheral vascular system by observation and palpation is normal  EYES:  Ophthalmoscopic exam of optic discs and posterior segments is normal; no papilledema or hemorrhages  No exam data present  MUSCULOSKELETAL:  Gait, strength, tone, movements noted in Neurologic exam below  NEUROLOGIC: MENTAL STATUS:  No flowsheet data found.  awake, alert, oriented to person, place and time  recent and remote memory intact  normal attention and concentration  language fluent, comprehension intact, naming intact  fund of knowledge appropriate  CRANIAL NERVE:   2nd - no papilledema on fundoscopic exam  2nd, 3rd, 4th, 6th - pupils equal and reactive to light, visual fields full to confrontation, extraocular muscles intact, no nystagmus  5th - facial sensation symmetric  7th - facial strength symmetric  8th - hearing intact  9th - palate elevates symmetrically, uvula  midline  11th - shoulder shrug symmetric  12th - tongue protrusion midline  MOTOR:   normal bulk and tone, full strength in the BUE, BLE  SENSORY:   normal and symmetric to light touch, temperature, vibration  COORDINATION:   finger-nose-finger, fine finger movements normal  REFLEXES:   deep tendon reflexes TRACE and symmetric  GAIT/STATION:   narrow based gait     DIAGNOSTIC DATA (LABS, IMAGING, TESTING) - I reviewed patient records, labs, notes, testing and imaging myself where available.  Lab Results  Component Value Date   WBC 7.0 08/19/2020   HGB 16.1 08/19/2020   HCT 47.5 08/19/2020   MCV 85.6 08/19/2020   PLT  151 08/19/2020      Component Value Date/Time   NA 135 08/19/2020 1121   K 4.6 08/19/2020 1121   CL 98 08/19/2020 1121   CO2 25 08/19/2020 1121   GLUCOSE 199 (H) 08/19/2020 1121   BUN 19 08/19/2020 1121   CREATININE 1.01 08/19/2020 1121   CALCIUM 9.3 08/19/2020 1121   PROT 7.4 08/19/2020 1121   ALBUMIN 4.1 09/11/2013 0001   AST 21 08/19/2020 1121   ALT 20 08/19/2020 1121   ALKPHOS 70 09/11/2013 0001   BILITOT 0.9 08/19/2020 1121   GFRNONAA 88 (L) 10/13/2012 1756   GFRAA >90 10/13/2012 1756   Lab Results  Component Value Date   CHOL 196 12/22/2014   HDL 37.80 (L) 12/22/2014   LDLCALC 119 (H) 05/02/2007   LDLDIRECT 127.0 12/22/2014   TRIG 230.0 (H) 12/22/2014   CHOLHDL 5 12/22/2014   Lab Results  Component Value Date   HGBA1C 8.6 (H) 08/19/2020   No results found for: VITAMINB12 Lab Results  Component Value Date   TSH 1.87 08/19/2020     08/29/20 MRI lumbar spine [I reviewed images myself and agree with interpretation. -VRP]  1. 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 2. Moderate left neural foraminal narrowing at L3-4 and L4-5.    ASSESSMENT AND PLAN  69 y.o. year old male here with:  Dx:  1. Weakness of both lower  extremities   2. Type 2 diabetes mellitus with other neurologic complication, without long-term current use of insulin (HCC)      PLAN:  LOWER EXTREMITY WEAKNESS (transient; could be orthostatic hypotension, hypovolemia, diabetic neuropathy, stress rxn) - continue diabetes treatment - continue nutrition and exercise changes  Return for return to PCP, pending if symptoms worsen or fail to improve.    Penni Bombard, MD 34/03/2193, 71:25 AM Certified in Neurology, Neurophysiology and Neuroimaging  [facility] [address], [place], [place] [address] [phone]

## 2020-08-31 NOTE — Telephone Encounter (Signed)
Please advise pt that I have placed the requested referral.  Also, I have reviewed his MRI.  MRI notes degenerative changes of his lower back and some nerves that are mildly compressed.  If he has severe low back pain in the future please let me know.  Otherwise, we will just keep an eye on it.

## 2020-08-31 NOTE — Telephone Encounter (Signed)
Patient advised of referral,MRI results and provider's comments. He verbalized understanding

## 2020-09-02 ENCOUNTER — Encounter: Payer: BLUE CROSS/BLUE SHIELD | Attending: Family | Admitting: Dietician

## 2020-09-02 ENCOUNTER — Other Ambulatory Visit: Payer: Self-pay

## 2020-09-02 ENCOUNTER — Encounter: Payer: Self-pay | Admitting: Dietician

## 2020-09-02 DIAGNOSIS — E119 Type 2 diabetes mellitus without complications: Secondary | ICD-10-CM

## 2020-09-02 NOTE — Patient Instructions (Signed)
Eat 30g of carbs (2 carb choices) at each meal, 5 times a day.  Continue to exercise daily!  Find ways to moderate caffeine intake.   Make sure to balance your meals with protein, and non-starchy vegetables.   Keep up with your perserverance, and put it towards glycemic control.  Watch your [place], and see how your body processes the food that you eat! Make adjustments accordingly!

## 2020-09-02 NOTE — Progress Notes (Signed)
Diabetes Self-Management Education  Visit Type: First/Initial  Appt. Start Time: 1400 Appt. End Time: 0109  09/02/2020  [name], identified by name and date of birth, is a 69 y.o. male with a diagnosis of Diabetes: Type 2.   ASSESSMENT  Pt went in for a regular check up  2 weeks ago and was found to have CBG of 199, and A1c of 8.6. Was put on metFomrin, and got a prescription for a [employer]. Will be starting to use [place] after he picks it up today. 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 Boss had CKD stage 5, and pt developed a diet for his boss to help him avoid going on dialysis. Pt reports knowing a lot about nutrition. Pt reports eating 3 meals, 2 snacks daily, 3 hours apart. Had an initial goal of "shrinking his stomach" and to lose 46 lbs. Pt is currently 256 lbs, and has lost 8 lbs in the last week. Pt is consuming 1500 kcal a day. Drinking 100-120 oz water. Pt reports feeling good on his diet, and has followed this diet before for weight loss. Eats no processed foods, lean meats, Physical activity 1.5 hrs a day 7 days a week, calisthenics. As well as walks 30 min a day, 3 days a week. Resistance training occasionally. 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 Only has regained 50-60% of smell and taste, but has had no changes in palette.   There were no vitals taken for this visit. There is no height or weight on file to calculate BMI.   Diabetes Self-Management Education - 09/02/20 1457      Visit Information   Visit Type First/Initial      Initial Visit   Diabetes Type Type 2    Are you currently following a meal plan? No    Are you taking your medications as prescribed? Yes    Date Diagnosed 08/19/2020      Health Coping   How would you rate your overall health? Fair      Psychosocial Assessment   Patient  Belief/Attitude about Diabetes Motivated to manage diabetes    Self-management support Doctor's office    Other persons present Patient    Patient Concerns Nutrition/Meal planning;Monitoring    Special Needs None    Preferred Learning Style No preference indicated    How often do you need to have someone help you when you read instructions, pamphlets, or other written materials from your doctor or pharmacy? 1 - Never    What is the last grade level you completed in school? College      Pre-Education Assessment   Patient understands the diabetes disease and treatment process. Needs Instruction    Patient understands incorporating nutritional management into lifestyle. Needs Instruction    Patient undertands incorporating physical activity into lifestyle. Needs Instruction    Patient understands using medications safely. Needs Instruction    Patient understands monitoring blood glucose, interpreting and using results Needs Instruction    Patient understands prevention, detection, and treatment of acute complications. Needs Instruction    Patient understands prevention, detection, and treatment of chronic complications. Needs Instruction    Patient understands how to develop strategies to address psychosocial issues. Needs Instruction    Patient understands how to develop strategies to promote health/change behavior. Needs Instruction  Complications   Last HgB A1C per patient/outside source 8.6 %   08/20/2020   How often do you check your blood sugar? 1-2 times/day    Fasting Blood glucose range (mg/dL) 130-179    Postprandial Blood glucose range (mg/dL) 130-179   Pt checks before bed   Number of hypoglycemic episodes per month 0    Number of hyperglycemic episodes per week 0    Have you had a dilated eye exam in the past 12 months? Yes    Have you had a dental exam in the past 12 months? Yes    Are you checking your feet? No      Exercise   Exercise Type ADL's;Light (walking /  raking leaves);Moderate (swimming / aerobic walking)    How many days per week to you exercise? 7    How many minutes per day do you exercise? 90    Total minutes per week of exercise 630      Patient Education   Previous Diabetes Education No    Disease state  Definition of diabetes, type 1 and 2, and the diagnosis of diabetes;Factors that contribute to the development of diabetes;Explored patient's options for treatment of their diabetes    Nutrition management  Role of diet in the treatment of diabetes and the relationship between the three main macronutrients and blood glucose level;Carbohydrate counting;Reviewed blood glucose goals for pre and post meals and how to evaluate the patients' food intake on their blood glucose level.;Meal options for control of blood glucose level and chronic complications.    Physical activity and exercise  Role of exercise on diabetes management, blood pressure control and cardiac health.;Helped patient identify appropriate exercises in relation to his/her diabetes, diabetes complications and other health issue.    Medications Reviewed patients medication for diabetes, action, purpose, timing of dose and side effects.    Monitoring Purpose and frequency of SMBG.;Taught/discussed recording of test results and interpretation of SMBG.;Identified appropriate SMBG and/or A1C goals.   Pt will be starting CGM 40/07/8118   Chronic complications Relationship between chronic complications and blood glucose control;Lipid levels, blood glucose control and heart disease    Psychosocial adjustment Role of stress on diabetes;Worked with patient to identify barriers to care and solutions;Identified and addressed patients feelings and concerns about diabetes    Personal strategies to promote health Lifestyle issues that need to be addressed for better diabetes care      Individualized Goals (developed by patient)   Nutrition Follow meal plan discussed    Physical Activity Exercise  5-7 days per week    Medications take my medication as prescribed    Monitoring  test my blood glucose as discussed    Health Coping ask for help with (comment)   Email dietitian with questions as necessary     Post-Education Assessment   Patient understands the diabetes disease and treatment process. Needs Review    Patient understands incorporating nutritional management into lifestyle. Demonstrates understanding / competency    Patient undertands incorporating physical activity into lifestyle. Needs Review    Patient understands using medications safely. Demonstrates understanding / competency    Patient understands monitoring blood glucose, interpreting and using results Demonstrates understanding / competency    Patient understands prevention, detection, and treatment of acute complications. Needs Review    Patient understands prevention, detection, and treatment of chronic complications. Needs Review    Patient understands how to develop strategies to address psychosocial issues. Needs Review    Patient understands how  to develop strategies to promote health/change behavior. Needs Review      Outcomes   Expected Outcomes Demonstrated interest in learning. Expect positive outcomes    Future DMSE 2 months    Program Status Not Completed           Individualized Plan for Diabetes Self-Management Training:   Learning Objective:  Patient will have a greater understanding of diabetes self-management. Patient education plan is to attend individual and/or group sessions per assessed needs and concerns.   Plan:   Patient Instructions  Eat 30g of carbs (2 carb choices) at each meal, 5 times a day.  Continue to exercise daily!  Find ways to moderate caffeine intake.   Make sure to balance your meals with protein, and non-starchy vegetables.   Keep up with your perserverance, and put it towards glycemic control.  Watch your [place], and see how your body processes  the food that you eat! Make adjustments accordingly!   Expected Outcomes:  Demonstrated interest in learning. Expect positive outcomes  Education material provided: ADA - How to Thrive: A Guide for Your Journey with Diabetes, Meal plan card and My Plate  If problems or questions, patient to contact team via:  Phone and Email  Future DSME appointment: 2 months

## 2020-10-06 ENCOUNTER — Ambulatory Visit: Payer: BLUE CROSS/BLUE SHIELD | Admitting: Internal Medicine

## 2020-10-06 DIAGNOSIS — Z0289 Encounter for other administrative examinations: Secondary | ICD-10-CM

## 2020-10-06 NOTE — Progress Notes (Deleted)
Name: [name]  MRN/ DOB: [id number], [birth year]   Age/ Sex: 69 y.o., male    PCP: Debbrah Alar, NP   Reason for Endocrinology Evaluation: Type 2 Diabetes Mellitus     Date of Initial Endocrinology Visit: 10/06/2020     PATIENT IDENTIFIER: [name] is a 69 y.o. male with a past medical history of T2DM, OSA and Obesity . The patient presented for initial endocrinology clinic visit on 10/06/2020 for consultative assistance with his diabetes management.    HPI: Mr. Rao was    Diagnosed with DM 07/2020 Prior Medications tried/Intolerance:Started on metformin on  Initial diagnosis Currently checking blood sugars *** x / day,  before breakfast and ***.  Hypoglycemia episodes : ***               Symptoms: ***                 Frequency: ***/  Hemoglobin A1c was 8.6% at presentation to our office Patient required assistance for hypoglycemia: no Patient has required hospitalization within the last 1 year from hyper or hypoglycemia: no  In terms of diet, the patient ***   HOME DIABETES REGIMEN: Metformin 500 mg BID    Statin: no ACE-I/ARB: no Prior Diabetic Education: {Yes/No:11203}   METER DOWNLOAD SUMMARY: Date range evaluated: *** Fingerstick Blood Glucose Tests = *** Average Number Tests/Day = *** Overall Mean FS Glucose = *** Standard Deviation = ***  BG Ranges: Low = *** High = ***   Hypoglycemic Events/30 Days: BG < 50 = *** Episodes of symptomatic severe hypoglycemia = ***   DIABETIC COMPLICATIONS: Microvascular complications:   ***  Denies: CKD  Last eye exam: Completed   Macrovascular complications:   ***  Denies: CAD, PVD, CVA   PAST HISTORY: Past Medical History:  Past Medical History:  Diagnosis Date  . Back pain    chronic  . GERD (gastroesophageal reflux disease)   . Hyperglycemia 08/20/2020  . Obesity   . OSA (obstructive sleep apnea)     Past Surgical History:  Past Surgical History:  Procedure  Laterality Date  . HEMANGIOMA EXCISION  1990   left leg  . HERNIA REPAIR    . KNEE SURGERY Right 2015  . ORIF RADIAL FRACTURE Left 07/20/2018   Procedure: LEFT ELBOW OPEN REDUCTION INTERNAL FIXATION (ORIF) AND REPAIR AS INDICATED, LIGAMENT AND BONE;  Surgeon: Iran Planas, MD;  Location: [facility];  Service: Orthopedics;  Laterality: Left;      Social History:  reports that he has never smoked. He has never used smokeless tobacco. He reports previous alcohol use. He reports that he does not use drugs. Family History:  Family History  Problem Relation Age of Onset  . Arthritis Mother        spine  . Heart disease Mother   . Hypertension Mother   . Cancer Mother        bone  . Heart disease Brother        murmur      HOME MEDICATIONS: Allergies as of 10/06/2020      Reactions   Other Other (See Comments)   Anesthetic "pentathol" caused hallucinations over 48 hrs   Latex    UNSPECIFIED REACTION    Mango Flavor [flavoring Agent] Rash, Other (See Comments)   Mango tree sap      Medication List       Accurate as of October 06, 2020  7:19 AM. If you have any questions, ask  your nurse or doctor.        blood glucose meter kit and supplies Kit Dispense based on patient and insurance preference. Use up to four times daily as directed. (FOR ICD-9 250.00, 250.01).   FreeStyle [place] 2 [employer] as directed   [employer] Use as directed   metFORMIN 500 MG tablet Commonly known as: GLUCOPHAGE Take 1 tablet (500 mg total) by mouth 2 (two) times daily with a meal.        ALLERGIES: Allergies  Allergen Reactions  . Other Other (See Comments)    Anesthetic "pentathol" caused hallucinations over 48 hrs  . Latex     UNSPECIFIED REACTION   . Mango Flavor [Flavoring Agent] Rash and Other (See Comments)    Mango tree sap     REVIEW OF SYSTEMS: A comprehensive ROS was conducted with the patient and is negative except as per HPI and below:  ROS     OBJECTIVE:   VITAL SIGNS: There were no vitals taken for this visit.   PHYSICAL EXAM:  General: Pt appears well and is in NAD  Hydration: Well-hydrated with moist mucous membranes and good skin turgor  HEENT: Head: Unremarkable with good dentition. Oropharynx clear without exudate.  Eyes: External eye exam normal without stare, lid lag or exophthalmos.  EOM intact.  PERRL.  Neck: General: Supple without adenopathy or carotid bruits. Thyroid: Thyroid size normal.  No goiter or nodules appreciated. No thyroid bruit.  Lungs: Clear with good BS bilat with no rales, rhonchi, or wheezes  Heart: RRR with normal S1 and S2 and no gallops; no murmurs; no rub  Abdomen: Normoactive bowel sounds, soft, nontender, without masses or organomegaly palpable  Extremities:  Lower extremities - No pretibial edema. No lesions.  Skin: Normal texture and temperature to palpation. No rash noted. No Acanthosis nigricans/skin tags. No lipohypertrophy.  Neuro: MS is good with appropriate affect, pt is alert and Ox3    DM foot exam:    DATA REVIEWED:  Lab Results  Component Value Date   HGBA1C 8.6 (H) 08/19/2020   Lab Results  Component Value Date   LDLCALC 119 (H) 05/02/2007   CREATININE 1.01 08/19/2020   No results found for: [facility]  Lab Results  Component Value Date   CHOL 196 12/22/2014   HDL 37.80 (L) 12/22/2014   LDLCALC 119 (H) 05/02/2007   LDLDIRECT 127.0 12/22/2014   TRIG 230.0 (H) 12/22/2014   CHOLHDL 5 12/22/2014        ASSESSMENT / PLAN / RECOMMENDATIONS:   1) Type 2 Diabetes Mellitus, ***controlled, With*** complications - Most recent A1c of *** %. Goal A1c < 7.0 %.    Plan: GENERAL:  ***  MEDICATIONS:  ***  EDUCATION / INSTRUCTIONS:  BG monitoring instructions: Patient is instructed to check his blood sugars *** times a day, ***.  Call [facility] if: BG persistently < 70  . I reviewed the Rule of 15 for the treatment of hypoglycemia in detail  with the patient. Literature supplied.   2) Diabetic complications:   Eye: Does *** have known diabetic retinopathy.   Neuro/ Feet: Does *** have known diabetic peripheral neuropathy.  Renal: Patient does not have known baseline CKD. He is not on an ACEI/ARB at present.   3) Dyslipidemia: LDL above goal at 127 mg/dL with elevated Tg at 230 mg/dL.He is not on a statin       Signed electronically by: Mack Guise, MD  [facility]inology  [facility] [address] [place], [place] [address] Phone: [phone] FAX: [phone]   CC: Debbrah Alar, NP [address] [place] [address] Phone: [phone]  Fax: [phone]    Return to Endocrinology clinic as below: Future Appointments  Date Time Provider [place]  10/06/2020  9:50 AM Lylia Karn, Melanie Crazier, MD [id number]  11/12/2020  2:00 PM Fredia Sorrow, RD [place] [facility]

## 2020-11-02 ENCOUNTER — Ambulatory Visit: Payer: BLUE CROSS/BLUE SHIELD | Admitting: Internal Medicine

## 2020-11-02 ENCOUNTER — Other Ambulatory Visit: Payer: Self-pay

## 2020-11-02 ENCOUNTER — Encounter: Payer: Self-pay | Admitting: Internal Medicine

## 2020-11-02 VITALS — BP 134/80 | HR 88 | Ht 70.5 in | Wt 250.0 lb

## 2020-11-02 DIAGNOSIS — E781 Pure hyperglyceridemia: Secondary | ICD-10-CM | POA: Diagnosis not present

## 2020-11-02 DIAGNOSIS — E1165 Type 2 diabetes mellitus with hyperglycemia: Secondary | ICD-10-CM | POA: Insufficient documentation

## 2020-11-02 DIAGNOSIS — G609 Hereditary and idiopathic neuropathy, unspecified: Secondary | ICD-10-CM

## 2020-11-02 LAB — LIPID PANEL
Cholesterol: 168 mg/dL (ref 0–200)
HDL: 40.7 mg/dL (ref >39.00)
LDL Cholesterol: 90 mg/dL (ref 0–99)
NonHDL: 127.59
Total CHOL/HDL Ratio: 4
Triglycerides: 189 mg/dL — ABNORMAL HIGH (ref 0.0–149.0)
VLDL: 37.8 mg/dL (ref 0.0–40.0)

## 2020-11-02 LAB — VITAMIN B12: Vitamin B-12: 618 pg/mL (ref 211–911)

## 2020-11-02 LAB — COMPREHENSIVE METABOLIC PANEL
ALT: 13 U/L (ref 0–53)
AST: 17 U/L (ref 0–37)
Albumin: 4.6 g/dL (ref 3.5–5.2)
Alkaline Phosphatase: 67 U/L (ref 39–117)
BUN: 20 mg/dL (ref 6–23)
CO2: 28 mEq/L (ref 19–32)
Calcium: 9.9 mg/dL (ref 8.4–10.5)
Chloride: 103 mEq/L (ref 96–112)
Creatinine, Ser: 0.93 mg/dL (ref 0.40–1.50)
GFR: 84.58 mL/min (ref >60.00)
Glucose, Bld: 109 mg/dL — ABNORMAL HIGH (ref 70–99)
Potassium: 4.3 mEq/L (ref 3.5–5.1)
Sodium: 138 mEq/L (ref 135–145)
Total Bilirubin: 0.8 mg/dL (ref 0.2–1.2)
Total Protein: 7.6 g/dL (ref 6.0–8.3)

## 2020-11-02 LAB — VITAMIN D 25 HYDROXY (VIT D DEFICIENCY, FRACTURES): VITD: 30.94 ng/mL (ref 30.00–100.00)

## 2020-11-02 LAB — POCT GLYCOSYLATED HEMOGLOBIN (HGB A1C): Hemoglobin A1C: 6.6 % — AB (ref 4.0–5.6)

## 2020-11-02 MED ORDER — ACCU-CHEK GUIDE VI STRP: ORAL_STRIP | 12 refills | Status: DC

## 2020-11-02 MED ORDER — METFORMIN HCL 500 MG PO TABS: 500.0000 mg | ORAL_TABLET | Freq: Two times a day (BID) | ORAL | 3 refills | Status: DC

## 2020-11-02 MED ORDER — ACCU-CHEK SOFTCLIX LANCETS MISC: 12 refills | Status: DC

## 2020-11-02 NOTE — Progress Notes (Signed)
Name: [name]  MRN/ DOB: [id number], [birth year]   Age/ Sex: 70 y.o., male    PCP: Debbrah Alar, NP   Reason for Endocrinology Evaluation: Type 2 Diabetes Mellitus     Date of Initial Endocrinology Visit: 11/02/2020     PATIENT IDENTIFIER: [name] is a 70 y.o. male with a past medical history of T2DM, OSA and Hypertriglyceridemia. The patient presented for initial endocrinology clinic visit on 11/02/2020 for consultative assistance with his diabetes management.    HPI: [name] was    Diagnosed with DM 10/ 2021 . Presented to PCP with dizziness, and leg weakness  Prior Medications tried/Intolerance: Started on Metformin 07/2020 Currently checking blood sugars 2 x / day,  before breakfast and Supper  Hypoglycemia episodes : no             Hemoglobin A1c has ranged from 6.6% in 10/2020, peaking at 8.6% in 2021. Patient required assistance for hypoglycemia: no Patient has required hospitalization within the last 1 year from hyper or hypoglycemia: no  In terms of diet, the patient eats 3 small meals a day and 2 snacks    Has lost 34 lbs since October,2021, he is on 1500 calories a day, he is on strict carb and fiber intake daily.  Exercise 1.25 hrs a day   He is on MVI   He denies any nausea or vomiting, dizziness has resolved   HOME DIABETES REGIMEN: Metformin 500 mg BID   Statin: no ACE-I/ARB: no Prior Diabetic Education: yes     GLUCOSE LOG:  110-155 mg/dL      DIABETIC COMPLICATIONS: Microvascular complications:    Denies: CKD, retinopathy , neuropathy   Last eye exam: Completed 09/04/2020  Macrovascular complications:    Denies: CAD, PVD, CVA   PAST HISTORY: Past Medical History:  Past Medical History:  Diagnosis Date  . Back pain    chronic  . GERD (gastroesophageal reflux disease)   . Hyperglycemia 08/20/2020  . Obesity   . OSA (obstructive sleep apnea)    Past Surgical History:  Past Surgical History:  Procedure  Laterality Date  . HEMANGIOMA EXCISION  1990   left leg  . HERNIA REPAIR    . KNEE SURGERY Right 2015  . ORIF RADIAL FRACTURE Left 07/20/2018   Procedure: LEFT ELBOW OPEN REDUCTION INTERNAL FIXATION (ORIF) AND REPAIR AS INDICATED, LIGAMENT AND BONE;  Surgeon: Iran Planas, MD;  Location: [facility];  Service: Orthopedics;  Laterality: Left;      Social History:  reports that he has never smoked. He has never used smokeless tobacco. He reports previous alcohol use. He reports that he does not use drugs. Family History:  Family History  Problem Relation Age of Onset  . Arthritis Mother        spine  . Heart disease Mother   . Hypertension Mother   . Cancer Mother        bone  . Heart disease Brother        murmur     HOME MEDICATIONS: Allergies as of 11/02/2020      Reactions   Other Other (See Comments)   Anesthetic "pentathol" caused hallucinations over 48 hrs   Latex    UNSPECIFIED REACTION    Mango Flavor [flavoring Agent] Rash, Other (See Comments)   Mango tree sap      Medication List       Accurate as of November 02, 2020 10:52 AM. If you have any questions, ask your  nurse or doctor.        blood glucose meter kit and supplies Kit Dispense based on patient and insurance preference. Use up to four times daily as directed. (FOR ICD-9 250.00, 250.01).   FreeStyle [place] 2 [employer] as directed   [employer] Use as directed   metFORMIN 500 MG tablet Commonly known as: GLUCOPHAGE Take 1 tablet (500 mg total) by mouth 2 (two) times daily with a meal.        ALLERGIES: Allergies  Allergen Reactions  . Other Other (See Comments)    Anesthetic "pentathol" caused hallucinations over 48 hrs  . Latex     UNSPECIFIED REACTION   . Mango Flavor [Flavoring Agent] Rash and Other (See Comments)    Mango tree sap     REVIEW OF SYSTEMS: A comprehensive ROS was conducted with the patient and is negative except as per HPI    OBJECTIVE:   VITAL  SIGNS: BP 134/80   Pulse 88   Ht 5' 10.5" (1.791 m)   Wt 250 lb (113.4 kg)   SpO2 97%   BMI 35.36 kg/m    PHYSICAL EXAM:  General: Pt appears well and is in NAD  Neck: General: Supple without adenopathy or carotid bruits. Thyroid: Thyroid size normal.  No goiter or nodules appreciated. N  Lungs: Clear with good BS bilat with no rales, rhonchi, or wheezes  Heart: RRR with normal S1 and S2 and no gallops; no murmurs; no rub  Abdomen: Normoactive bowel sounds, soft, nontender, without masses or organomegaly palpable  Extremities:  Lower extremities - No pretibial edema. No lesions.  Neuro: MS is good with appropriate affect, pt is alert and Ox3    DM foot exam: 11/02/2020  The skin of the feet is intact without sores or ulcerations. The pedal pulses are 2+ on right and 2+ on left. The sensation is decreased to a screening 5.07, 10 gram monofilament bilaterally    DATA REVIEWED:  Lab Results  Component Value Date   HGBA1C 6.6 (A) 11/02/2020   HGBA1C 8.6 (H) 08/19/2020   Results for HENRRY, FEIL (MRN [id number]) as of 11/04/2020 12:11  Ref. Range 11/02/2020 11:33  Sodium Latest Ref Range: 135 - 145 mEq/L 138  Potassium Latest Ref Range: 3.5 - 5.1 mEq/L 4.3  Chloride Latest Ref Range: 96 - 112 mEq/L 103  CO2 Latest Ref Range: 19 - 32 mEq/L 28  Glucose Latest Ref Range: 70 - 99 mg/dL 109 (H)  BUN Latest Ref Range: 6 - 23 mg/dL 20  Creatinine Latest Ref Range: 0.40 - 1.50 mg/dL 0.93  Calcium Latest Ref Range: 8.4 - 10.5 mg/dL 9.9  Alkaline Phosphatase Latest Ref Range: 39 - 117 U/L 67  Albumin Latest Ref Range: 3.5 - 5.2 g/dL 4.6  AST Latest Ref Range: 0 - 37 U/L 17  ALT Latest Ref Range: 0 - 53 U/L 13  Total Protein Latest Ref Range: 6.0 - 8.3 g/dL 7.6  Total Bilirubin Latest Ref Range: 0.2 - 1.2 mg/dL 0.8  GFR Latest Ref Range: >60.00 mL/min 84.58  Total CHOL/HDL Ratio Unknown 4  Cholesterol Latest Ref Range: 0 - 200 mg/dL 168  HDL Cholesterol Latest Ref Range: >39.00  mg/dL 40.70  LDL (calc) Latest Ref Range: 0 - 99 mg/dL 90  NonHDL Unknown 127.59  Triglycerides Latest Ref Range: 0.0 - 149.0 mg/dL 189.0 (H)  VLDL Latest Ref Range: 0.0 - 40.0 mg/dL 37.8  VITD Latest Ref Range: 30.00 - 100.00  ng/mL 30.94  Vitamin B12 Latest Ref Range: 211 - 911 pg/mL 618    ASSESSMENT / PLAN / RECOMMENDATIONS:   1) Type 2 Diabetes Mellitus, Poorly controlled, With neuropathic  complications - Most recent A1c of 7.0 %. Goal A1c < 7.0 %.    Plan: GENERAL: I have discussed with the patient the pathophysiology of diabetes. We went over the natural progression of the disease. We talked about both insulin resistance and insulin deficiency. We stressed the importance of lifestyle changes including diet and exercise. I explained the complications associated with diabetes including retinopathy, nephropathy, neuropathy as well as increased risk of cardiovascular disease. We went over the benefit seen with glycemic control.    I explained to the patient that diabetic patients are at higher than normal risk for amputations.   He already has met with a dietitian but would like to meet with a certified diabetic educator. He has made a lot of research and made drastic dietary changes, has already lost 34 lbs and working on losing more.     MEDICATIONS:  Continue Metformin 500 mg BID   EDUCATION / INSTRUCTIONS:  BG monitoring instructions: Patient is instructed to check his blood sugars 2 times a day.  Call [facility] clinic if: BG persistently < 70  . I reviewed the Rule of 15 for the treatment of hypoglycemia in detail with the patient. Literature supplied.   2) Diabetic complications:   Eye: Does not have known diabetic retinopathy.   Neuro/ Feet: Does  have known diabetic peripheral neuropathy.  Renal: Patient does not have known baseline CKD. He is not on an ACEI/ARB at present.  3) Hypertriglyceridemia: Patient is not on a statin. I have recommended  statin intake ,we discussed ADA recommendation and cardiovascular benefits of statin, lipid check today showed elevated Tg . Will offer statin therapy        4) Peripheral Neuropathy:  - This was noted on foot exam , he has no personal diagnosis of neuropathy in the past but has strong Fh of neuropathy.  - We discussed the importance of continuing to control his diabetes to slow down or stop any further damage  - B12 and Vitamin D levels are normal    F/U 3 months    Signed electronically by: Mack Guise, MD  [facility] Endocrin[facility]  [address] [place], [place] [address] Phone: [phone] FAX: [phone]   CC: Debbrah Alar, NP [address] [place] [address] Phone: [phone]  Fax: [phone]    Return to Endocrinology clinic as below: Future Appointments  Date Time Provider [place]  11/12/2020  2:00 PM Fredia Sorrow, RD [place] [facility]

## 2020-11-02 NOTE — Patient Instructions (Signed)
-   Continue Metformin 500 mg, Twice daily

## 2020-11-04 ENCOUNTER — Telehealth: Payer: Self-pay | Admitting: Internal Medicine

## 2020-11-04 ENCOUNTER — Encounter: Payer: Self-pay | Admitting: Internal Medicine

## 2020-11-04 ENCOUNTER — Encounter: Payer: Self-pay | Admitting: *Deleted

## 2020-11-04 NOTE — Telephone Encounter (Signed)
Spoken to patient and notified Dr Harvel Ricks comments. Verbalized understanding. However, patient would like a copy of his lab results. Left a printed copy in the front office as requested.  Patient will have to think about the cholesterol medication. He will let us know when he comes by to do the urine test that he could not provided during the appointment.

## 2020-11-04 NOTE — Telephone Encounter (Signed)
Please let the pt know that his kidney function, potassium , vitamin B12 and Vitamin D are all normal.    His Triglycerides ( part of cholesterol)  are slightly elevated and his bad cholesterol ( LDL ) is acceptable but I would recommend starting him on a cholesterol medicine per our office discussion.     Atorvastatin 10 mg daily    Please let me know if he is ok with this.   Side effects include out of ordinary aches and pains, but if develops them, needs to stop it.     Thanks    Abby Raelyn Mora, MD  [facility]  [facility] [address] [place], [place] [address] Phone: [phone] FAX: [phone]

## 2020-11-12 ENCOUNTER — Ambulatory Visit: Payer: BLUE CROSS/BLUE SHIELD | Admitting: Dietician

## 2020-11-24 ENCOUNTER — Ambulatory Visit: Payer: BLUE CROSS/BLUE SHIELD | Admitting: Internal Medicine

## 2020-12-15 ENCOUNTER — Telehealth: Payer: Self-pay | Admitting: Family

## 2020-12-15 NOTE — Telephone Encounter (Signed)
Would like to speak to Dr. Kelton Pillar cma .. he refuses to elaborate.

## 2020-12-15 NOTE — Telephone Encounter (Signed)
Spoken to patient and he stated he had a few questions for Dr Kelton Pillar. He wanted to see her sooner, so I have reschedule patient to be seen on 01/19/2021 instead of April. Also patient wanted to sign up for MyChart so I sent the link to patient via email.

## 2020-12-21 ENCOUNTER — Ambulatory Visit: Payer: BLUE CROSS/BLUE SHIELD | Admitting: Family

## 2020-12-21 ENCOUNTER — Encounter: Payer: Self-pay | Admitting: Family

## 2020-12-21 ENCOUNTER — Telehealth: Payer: Self-pay | Admitting: Family

## 2020-12-21 ENCOUNTER — Other Ambulatory Visit: Payer: Self-pay

## 2020-12-21 VITALS — BP 141/68 | HR 69 | Temp 97.7°F | Resp 16 | Ht 71.0 in | Wt 227.0 lb

## 2020-12-21 DIAGNOSIS — G4700 Insomnia, unspecified: Secondary | ICD-10-CM | POA: Diagnosis not present

## 2020-12-21 DIAGNOSIS — R29898 Other symptoms and signs involving the musculoskeletal system: Secondary | ICD-10-CM | POA: Diagnosis not present

## 2020-12-21 DIAGNOSIS — Z23 Encounter for immunization: Secondary | ICD-10-CM | POA: Diagnosis not present

## 2020-12-21 DIAGNOSIS — E1165 Type 2 diabetes mellitus with hyperglycemia: Secondary | ICD-10-CM | POA: Diagnosis not present

## 2020-12-21 DIAGNOSIS — Z0000 Encounter for general adult medical examination without abnormal findings: Secondary | ICD-10-CM

## 2020-12-21 LAB — MICROALBUMIN / CREATININE URINE RATIO
Creatinine,U: 53 mg/dL
Microalb Creat Ratio: 1.3 mg/g (ref 0.0–30.0)
Microalb, Ur: <0.7 mg/dL (ref 0.0–1.9)

## 2020-12-21 NOTE — Telephone Encounter (Signed)
Please call Dr. Luretha Rued [facility]) and request a copy of his last eye exam.

## 2020-12-21 NOTE — Telephone Encounter (Signed)
Request faxed

## 2020-12-21 NOTE — Progress Notes (Signed)
Subjective:    Patient ID: [name], male    DOB: [birth year], 70 y.o.   MRN: [id number]  HPI  Patient is a 70 yr old male who presents today for follow up.  LE weakness- he complained of same back in 08/29/20. MRI noted the following:    IMPRESSION: 1. 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 2. Moderate left neural foraminal narrowing at L3-4 and L4-5.  DM2- following with endocrinology.  Lab Results  Component Value Date   HGBA1C 6.6 (A) 11/02/2020   HGBA1C 8.6 (H) 08/19/2020   Lab Results  Component Value Date   LDLCALC 90 11/02/2020   CREATININE 0.93 11/02/2020   Since last visit he saw Neurology for his LE weakness.  Neurology did not find any concerning neurologic findings and felt that his transient LE weakness differential included:  could be orthostatic hypotension, hypovolemia, diabetic neuropathy, stress rxn. He reports some improvement in the weakness. He would like a referral to PT.    He is working on weight loss and exercise.  Wt Readings from Last 3 Encounters:  12/21/20 227 lb (103 kg)  11/02/20 250 lb (113.4 kg)  08/31/20 258 lb (117 kg)   Notes trouble sleeping at night.  Trying not to look at his cell phone in bed.  Notes that he falls asleep and wakes back up.  Used to have to get up to use the restroom more, but that is improved.    Review of Systems See HPI  Past Medical History:  Diagnosis Date  . Back pain    chronic  . GERD (gastroesophageal reflux disease)   . Hyperglycemia 08/20/2020  . Obesity   . OSA (obstructive sleep apnea)      Social History   Socioeconomic History  . Marital status: Single    Spouse name: Not on file  . Number of children: Not on file  . Years of education: Not on file  . Highest education level: Not on file  Occupational History  . Not on file  Tobacco Use  . Smoking status: Never Smoker  .  Smokeless tobacco: Never Used  Substance and Sexual Activity  . Alcohol use: Not Currently  . Drug use: No  . Sexual activity: Not on file  Other Topics Concern  . Not on file  Social History Narrative  . Not on file   Social Determinants of Health   Financial Resource Strain: Not on file  Food Insecurity: Not on file  Transportation Needs: Not on file  Physical Activity: Not on file  Stress: Not on file  Social Connections: Not on file  Intimate Partner Violence: Not on file    Past Surgical History:  Procedure Laterality Date  . HEMANGIOMA EXCISION  1990   left leg  . HERNIA REPAIR    . KNEE SURGERY Right 2015  . ORIF RADIAL FRACTURE Left 07/20/2018   Procedure: LEFT ELBOW OPEN REDUCTION INTERNAL FIXATION (ORIF) AND REPAIR AS INDICATED, LIGAMENT AND BONE;  Surgeon: Iran Planas, MD;  Location: [facility];  Service: Orthopedics;  Laterality: Left;    Family History  Problem Relation Age of Onset  . Arthritis Mother        spine  . Heart disease Mother   . Hypertension Mother   . Cancer Mother        bone  . Heart disease Brother  murmur    Allergies  Allergen Reactions  . Other Other (See Comments)    Anesthetic "pentathol" caused hallucinations over 48 hrs  . Latex     UNSPECIFIED REACTION   . Mango Flavor [Flavoring Agent] Rash and Other (See Comments)    Mango tree sap    Current Outpatient Medications on File Prior to Visit  Medication Sig Dispense Refill  . Accu-Chek Softclix Lancets lancets twice daily 100 each 12  . aspirin EC 81 MG tablet Take 81 mg by mouth daily. Swallow whole.    . blood glucose meter kit and supplies KIT Dispense based on patient and insurance preference. Use up to four times daily as directed. (FOR ICD-9 250.00, 250.01). 1 each 0  . Continuous Blood Gluc Receiver (FREESTYLE LIBRE 2 READER) DEVI Use as directed 1 each 0  . Continuous Blood Gluc Sensor (FREESTYLE LIBRE 2 SENSOR) MISC Use as directed 6 each 5  . glucose blood  (ACCU-CHEK GUIDE) test strip Twice daily 100 each 12  . metFORMIN (GLUCOPHAGE) 500 MG tablet Take 1 tablet (500 mg total) by mouth 2 (two) times daily with a meal. 180 tablet 3   No current facility-administered medications on file prior to visit.    BP (!) 141/68 (BP Location: Right Arm, Patient Position: Sitting, Cuff Size: Large)   Pulse 69   Temp 97.7 F (36.5 C) (Oral)   Resp 16   Ht 5' 11" (1.803 m)   Wt 227 lb (103 kg)   SpO2 100%   BMI 31.66 kg/m       Objective:   Physical Exam Constitutional:      General: He is not in acute distress.    Appearance: He is well-developed and well-nourished.  HENT:     Head: Normocephalic and atraumatic.  Cardiovascular:     Rate and Rhythm: Normal rate and regular rhythm.     Heart sounds: No murmur heard.   Pulmonary:     Effort: Pulmonary effort is normal. No respiratory distress.     Breath sounds: Normal breath sounds. No wheezing or rales.  Musculoskeletal:        General: No edema.  Skin:    General: Skin is warm and dry.  Neurological:     Mental Status: He is alert and oriented to person, place, and time.  Psychiatric:        Mood and Affect: Mood and affect normal.        Behavior: Behavior normal.        Thought Content: Thought content normal.           Assessment & Plan:  Weakness of lower extremities- refer to PT.  DM2- improved considerably- management per endocrinology. Encouraged pt to continue his work on diet/exercise/weight loss.  Check urine microalbumin.  Insomnia-  Discussed sleep hygiene.  Patient is advised as follows:  Try Melatonin 1 mg one hour before bed. You can increase as needed to 5mg.  35 minutes spent on today's visit.  Time was spent counseling patient, reviewing medical records and formulating medical plan.   Shingrix and pneumovax 23 today.   This visit occurred during the SARS-CoV-2 public health emergency.  Safety protocols were in place, including screening questions prior  to the visit, additional usage of staff PPE, and extensive cleaning of exam room while observing appropriate contact time as indicated for disinfecting solutions.    

## 2020-12-21 NOTE — Patient Instructions (Signed)
Try Melatonin 1 mg one hour before bed. You can increase as needed to 5mg .

## 2020-12-31 ENCOUNTER — Encounter: Payer: Self-pay | Admitting: Gastroenterology

## 2021-01-01 ENCOUNTER — Telehealth: Payer: Self-pay | Admitting: Gastroenterology

## 2021-01-01 NOTE — Telephone Encounter (Signed)
Hi Dr. Fuller Plan,  We received a referral for patient to have a repeat colonoscopy. Patient is requesting a transfer of care over to Dr. Lyndel Safe.  Please advise and thank you

## 2021-01-01 NOTE — Telephone Encounter (Signed)
OK per patient preference.  

## 2021-01-01 NOTE — Telephone Encounter (Signed)
Hi Dr. Lyndel Safe,   Would you approved this transfer?   Thank you

## 2021-01-04 ENCOUNTER — Ambulatory Visit: Payer: BLUE CROSS/BLUE SHIELD | Admitting: Physical Therapy

## 2021-01-11 ENCOUNTER — Other Ambulatory Visit: Payer: Self-pay

## 2021-01-11 ENCOUNTER — Encounter: Payer: Self-pay | Admitting: Gastroenterology

## 2021-01-11 ENCOUNTER — Ambulatory Visit (AMBULATORY_SURGERY_CENTER): Payer: BLUE CROSS/BLUE SHIELD | Admitting: *Deleted

## 2021-01-11 VITALS — Ht 70.5 in | Wt 222.0 lb

## 2021-01-11 DIAGNOSIS — Z86010 Personal history of colonic polyps: Secondary | ICD-10-CM

## 2021-01-11 MED ORDER — SUPREP BOWEL PREP KIT 17.5-3.13-1.6 GM/177ML PO SOLN: 1.0000 | Freq: Once | ORAL | 0 refills | Status: AC

## 2021-01-11 NOTE — Progress Notes (Signed)
No egg or soy allergy known to patient  No issues with past sedation with any surgeries or procedures No intubation problems in the past  No FH of Malignant Hyperthermia No diet pills per patient No home 02 use per patient  No blood thinners per patient  Pt denies issues with constipation  No A fib or A flutter  EMMI video to pt or via [place] guidelines implemented in PV today with Pt and RN  Pt is fully vaccinated  for [employer] given to pt in PV today , Code to Pharmacy and  NO PA's for preps discussed with pt In PV today  Discussed with pt there will be an out-of-pocket cost for prep and that varies from $0 to 70 dollars   Due to the COVID-19 pandemic we are asking patients to follow certain guidelines.  Pt aware of COVID protocols and [facility] guidelines

## 2021-01-15 ENCOUNTER — Encounter: Payer: Self-pay | Admitting: Physical Therapy

## 2021-01-15 ENCOUNTER — Ambulatory Visit: Payer: BLUE CROSS/BLUE SHIELD | Attending: Family | Admitting: Physical Therapy

## 2021-01-15 ENCOUNTER — Other Ambulatory Visit: Payer: Self-pay

## 2021-01-15 DIAGNOSIS — R293 Abnormal posture: Secondary | ICD-10-CM | POA: Diagnosis present

## 2021-01-15 DIAGNOSIS — M6281 Muscle weakness (generalized): Secondary | ICD-10-CM | POA: Diagnosis present

## 2021-01-15 DIAGNOSIS — R29898 Other symptoms and signs involving the musculoskeletal system: Secondary | ICD-10-CM | POA: Diagnosis not present

## 2021-01-15 NOTE — Therapy (Signed)
[facility] [address]  [place], [place], [address] Phone: [phone]   Fax:  [phone]  Physical Therapy Evaluation  Patient Details  Name: [name] MRN: [id number] Date of Birth: [birth year] Referring Provider (PT): Debbrah Alar, NP   Encounter Date: 01/15/2021   PT End of Session - 01/15/21 1034    Visit Number 1    Number of Visits 5    Date for PT Re-Evaluation 02/12/21    Authorization Type [employer]    PT Start Time [phone]    PT Stop Time 0932    PT Time Calculation (min) 48 min    Activity Tolerance Patient tolerated treatment well    Behavior During Therapy [facility] for tasks assessed/performed           Past Medical History:  Diagnosis Date  . Back pain    chronic  . Diabetes mellitus without complication ([place])   . GERD (gastroesophageal reflux disease)   . Hyperglycemia 08/20/2020  . Obesity   . OSA (obstructive sleep apnea)   . Sleep apnea    wears cpap     Past Surgical History:  Procedure Laterality Date  . COLONOSCOPY    . HEMANGIOMA EXCISION  1990   left leg  . HERNIA REPAIR    . KNEE SURGERY Right 2015  . ORIF RADIAL FRACTURE Left 07/20/2018   Procedure: LEFT ELBOW OPEN REDUCTION INTERNAL FIXATION (ORIF) AND REPAIR AS INDICATED, LIGAMENT AND BONE;  Surgeon: Iran Planas, MD;  Location: [facility];  Service: Orthopedics;  Laterality: Left;  . POLYPECTOMY     TA -x 1   . TOE SURGERY     remove ingrown toenail     There were no vitals filed for this visit.    Subjective Assessment - 01/15/21 0845    Subjective Patient reports that his Dr. told him that he has a problem with his back. Had episodes of his LEs feeling weak and his leg shaking- may have passed out once.  Notes that he has since been diagnosed with DM and since getting this under control, he is no longer having these episodes. However after periods of prolonged sitting, he still feels a little weak but nowhere as  bad. Most episodes occur after periods of sitting. Now trying to get up and walk every hour. Had recently lost 50 lbs by changing his diet and doing "intense exercise" to try to control his DM. Has been working on resisted arm and LE exercises daily and walking. Reports intermittent pain b/w his shoulder blades but no LBP. Denies N/T in B LEs. Denies changes in B&B control.    Pertinent History DMII, hyperglycemia, GERD, back pain, R knee surgery 2015, L radial ORIF 2019    Limitations Sitting;Walking;Standing;Lifting;House hold activities    How long can you sit comfortably? unlimited    How long can you stand comfortably? unlimited    How long can you walk comfortably? unlimited- currently walking 1.5-2.25 miles/day    Diagnostic tests 08/29/20 lumbar MRI: . 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 Moderate left neural foraminal narrowing at L3-4 and L4-5    Patient Stated Goals improve leg strength    Currently in Pain? No/denies              [facility] PT Assessment - 01/15/21 0859      Assessment   Medical Diagnosis Weakness  of LE    Referring Provider (PT) Debbrah Alar, NP    Onset Date/Surgical Date 07/01/20    Next MD Visit not schedule    Prior Therapy yes- for knee      Precautions   Precautions None      Balance Screen   Has the patient fallen in the past 6 months Yes    How many times? 1   report of controlled fall during LE shakiness episode   Has the patient had a decrease in activity level because of a fear of falling?  No    Is the patient reluctant to leave their home because of a fear of falling?  No      Home Environment   Living Environment Private residence    Living Arrangements Alone    Type of [place] Access Stairs to enter    Entrance Stairs-Number of Steps 2    Entrance Stairs-Rails None    Home Layout Two level    Alternate Level  Stairs-Number of Steps 11    Alternate Level Stairs-Rails Left;Right      Prior Function   Level of Independence Independent    Vocation Full time employment    Vocation Requirements driving, sitting    Leisure none      Cognition   Overall Cognitive Status Within Functional Limits for tasks assessed      Sensation   Light Touch Appears Intact      Coordination   Gross Motor Movements are Fluid and Coordinated Yes      Posture/Postural Control   Posture/Postural Control Postural limitations    Postural Limitations Rounded Shoulders;Forward head      ROM / Strength   AROM / PROM / Strength AROM;Strength      AROM   AROM Assessment Site Lumbar    Lumbar Flexion to toes   c/o mild pain b/w shpoulder blades; pt reporting that he can reach the floor   Lumbar Extension moderately limited    Lumbar - Right Side Bend jt line   heavy compensations; c/o discomfort in LB   Lumbar - Left Side Bend jt line   heavy compensations; c/o discomfort in LB   Lumbar - Right Rotation WFL    Lumbar - Left Rotation [facility]      Strength   Strength Assessment Site Hip;Knee;Ankle    Right/Left Hip Right;Left    Right Hip Flexion 4/5    Right Hip External Rotation  4+/5    Right Hip Internal Rotation 4+/5    Right Hip ABduction 4+/5    Right Hip ADduction 4+/5    Left Hip Flexion 4/5    Left Hip External Rotation 4+/5    Left Hip Internal Rotation 4+/5    Left Hip ABduction 4+/5    Left Hip ADduction 4+/5    Right/Left Knee Right;Left    Right Knee Flexion 4/5    Right Knee Extension 5/5    Left Knee Flexion 4/5    Left Knee Extension 5/5    Right/Left Ankle Right;Left    Right Ankle Dorsiflexion 4+/5    Right Ankle Plantar Flexion 4/5   heavy compensations and UE support   Left Ankle Dorsiflexion 4+/5    Left Ankle Plantar Flexion 4/5   heavy compensations and UE support     Flexibility   Soft Tissue Assessment /Muscle Length yes    Hamstrings B WNL    Quadriceps B WNL in mod thomas  Piriformis R WNL, L mildly tight in fig 4 and KTOS      Palpation   Palpation comment no TTP over LB or hips/buttocks      Ambulation/Gait   Assistive device None    Gait Pattern Step-through pattern;Lateral trunk lean to right;Lateral trunk lean to left   increased B toe out   Ambulation Surface Level;Indoor    Gait velocity WFL                      Objective measurements completed on examination: See above findings.               PT Education - 01/15/21 1034    Education Details prognosis, POC, HEP    Person(s) Educated Patient    Methods Explanation;Demonstration;Tactile cues;Verbal cues;Handout    Comprehension Verbalized understanding;Returned demonstration            PT Short Term Goals - 01/15/21 1040      PT SHORT TERM GOAL #1   Title Patient to be independent with initial HEP.    Time 2    Period Weeks    Status New    Target Date 01/29/21             PT Long Term Goals - 01/15/21 1041      PT LONG TERM GOAL #1   Title Patient to be independent with advanced HEP.    Time 4    Period Weeks    Status New    Target Date 02/12/21      PT LONG TERM GOAL #2   Title Patient to demonstrate B LE strength >/=4+/5.    Time 4    Period Weeks    Status New    Target Date 02/12/21      PT LONG TERM GOAL #3   Title Patient to demonstrate no tightness in L piriformis with stretching.    Time 4    Period Weeks    Status New    Target Date 02/12/21      PT LONG TERM GOAL #4   Title Patient to demonstrate lumbar AROM WFL and without pain limiting.    Time 4    Period Weeks    Status New    Target Date 02/12/21      PT LONG TERM GOAL #5   Title Patient to report 70% improvement in frequency of LE weakness upon standing from prolonged sitting.    Time 4    Period Weeks    Status New    Target Date 02/12/21                  Plan - 01/15/21 1035    Clinical Impression Statement Patient is a 70 y/o M presenting to OPPT  with c/o LE weakness for the past 8 months, around the time he was diagnosed with DMII. Notes that initially he had episodes of LEs feeling weak and shaking, however since getting his blood glucose under control these have been less frequent. Still does have periods of less severe LE weakness after periods of prolonged sitting. Denies N/T, changes in B&B control, LBP pain. Patient today presenting with rounded shoulders and forward head posture, limited lumbar extension and sidebending AROM, decreased B hip flexor, HS, and calf strength, and limited L piriformis flexibility. Patient was educated on gentle LE strengthening HEP- patient reported understanding. Would benefit from skilled PT services 1x/week for 4 weeks to address aforementioned impairments.  Personal Factors and Comorbidities Comorbidity 3+;Age;Time since onset of injury/illness/exacerbation;Past/Current Experience    Comorbidities DMII, hyperglycemia, GERD, back pain, R knee arthroscopy 2015, L radial ORIF 2019    Examination-Activity Limitations Sit;Squat;Bend;Stairs;Carry;Stand;Transfers;Dressing;Locomotion Level;Lift    Examination-Participation Restrictions Church;Cleaning;Shop;Community Activity;Driving;Dorita [place]    Stability/Clinical Decision Making Stable/Uncomplicated    Clinical Decision Making Low    Rehab Potential Good    PT Frequency 1x / week    PT Duration 4 weeks    PT Treatment/Interventions ADLs/Self Care Home Management;Cryotherapy;Electrical Stimulation;Moist Heat;Therapeutic exercise;Therapeutic activities;Functional mobility training;Stair training;Gait training;Ultrasound;Neuromuscular re-education;Patient/family education;Manual techniques;Taping;Energy conservation;Dry needling;Passive range of motion    PT Next Visit Plan reassess HEP; progress LE strengthening, lumbar extension and SBing AROM, L piriformis stretching    Consulted and Agree with Plan of Care Patient           Patient will benefit  from skilled therapeutic intervention in order to improve the following deficits and impairments:  Hypomobility,Decreased activity tolerance,Decreased strength,Improper body mechanics,Decreased range of motion,Impaired flexibility,Postural dysfunction  Visit Diagnosis: Other symptoms and signs involving the musculoskeletal system  Muscle weakness (generalized)  Abnormal posture     Problem List Patient Active Problem List   Diagnosis Date Noted  . Type 2 diabetes mellitus with hyperglycemia, without long-term current use of insulin ([place]) 11/02/2020  . Hyperglycemia 08/20/2020  . Hypertriglyceridemia 12/23/2014  . Abnormal finding on CT scan 12/22/2014  . Skin lesion of face 12/22/2014  . Plantar wart of left foot 09/12/2013  . Onychomycosis 12/19/2012  . SHOULDER PAIN, LEFT, CHRONIC 06/15/2010  . MORBID OBESITY 08/31/2009  . INTERMITTENT VERTIGO 08/28/2009  . PALPITATIONS 05/08/2009  . Obstructive sleep apnea 08/09/2007  . GERD 08/09/2007      Janene Harvey, PT, DPT 01/15/21 10:44 AM   [facility] [address]  [place], [place], [address] Phone: [phone]   Fax:  [phone]  Name: [name] MRN: [id number] Date of Birth: [birth year]

## 2021-01-18 NOTE — Progress Notes (Signed)
Name: [name]  Age/ Sex: 70 y.o., male   MRN/ DOB: [id number], 16-Nov-1950     PCP: Debbrah Alar, NP   Reason for Endocrinology Evaluation: Type 2 Diabetes Mellitus  Initial Endocrine Consultative Visit: 11/02/2020    PATIENT IDENTIFIER: [name] is a 70 y.o. male with a past medical history of T2DM, OSA and dyslipidemia. The patient has followed with Endocrinology clinic since 11/02/2020 for consultative assistance with management of his diabetes.  DIABETIC HISTORY:  [name] was diagnosed with DM 07/2020 after presenting to his PCP with symptomatic hyperglycemia. He was started on Metformin 07/2020.Marland Kitchen His hemoglobin A1c has ranged from 6.6% in 10/2020, peaking at 8.6% in 2021.    DYSLIPIDEMIA HISTORY: He has a hx of elevated Tg as high as 230 mg/dL in 12/2014, as well as an LDL as high as 127 mg/dL. He was started on statin therapy 12/2020    SUBJECTIVE:   During the last visit (11/03/2019): A1c 6.6 %, we continued Metformin   Today (01/19/2021): [name] is here for a follow up on diabetes management. He checks his blood sugars multiple times daily,through CGM. The patient has not had hypoglycemic episodes since the last clinic visit.   Scheduled for colonoscopy on Thursday He continues to exercise  Denies nausea or diarrhea    HOME DIABETES REGIMEN:  Metformin 500 mg BID  Atorvastatin 10 mg daily       Statin : not yet ACE-I/ARB: no Prior Diabetic Education: yes   CONTINUOUS GLUCOSE MONITORING RECORD INTERPRETATION    Dates of Recording: 3/9-3/22/2022  Sensor description:freestyle libre  Results statistics:   CGM use % of time 70  Average and SD 114/11.7  Time in range      100  %  % Time Above 180 0  % Time above 250 0  % Time Below target 0      Glycemic patterns summary: Optimal BG readings all day and night   Hyperglycemic episodes  N/A  Hypoglycemic episodes occurred n/A  Overnight periods: stable         DIABETIC COMPLICATIONS: Microvascular complications:    Denies: CKD,. Retinopathy, neuropathy  Last Eye Exam: Completed 09/04/2020  Macrovascular complications:    Denies: CAD, CVA, PVD   HISTORY:  Past Medical History:  Past Medical History:  Diagnosis Date  . Back pain    chronic  . Diabetes mellitus without complication ([place])   . GERD (gastroesophageal reflux disease)   . Hyperglycemia 08/20/2020  . Obesity   . OSA (obstructive sleep apnea)   . Sleep apnea    wears cpap    Past Surgical History:  Past Surgical History:  Procedure Laterality Date  . COLONOSCOPY    . HEMANGIOMA EXCISION  1990   left leg  . HERNIA REPAIR    . KNEE SURGERY Right 2015  . ORIF RADIAL FRACTURE Left 07/20/2018   Procedure: LEFT ELBOW OPEN REDUCTION INTERNAL FIXATION (ORIF) AND REPAIR AS INDICATED, LIGAMENT AND BONE;  Surgeon: Iran Planas, MD;  Location: [facility];  Service: Orthopedics;  Laterality: Left;  . POLYPECTOMY     TA -x 1   . TOE SURGERY     remove ingrown toenail     Social History:  reports that he has never smoked. He has never used smokeless tobacco. He reports previous alcohol use. He reports that he does not use drugs. Family History:  Family History  Problem Relation Age of Onset  . Arthritis Mother  spine  . Heart disease Mother   . Hypertension Mother   . Cancer Mother        bone  . Heart disease Brother        murmur  . Colon polyps Father   . Colon cancer Neg Hx   . Esophageal cancer Neg Hx   . Rectal cancer Neg Hx   . Stomach cancer Neg Hx      HOME MEDICATIONS: Allergies as of 01/19/2021      Reactions   Other Other (See Comments)   Anesthetic "pentathol" caused hallucinations over 48 hrs   Latex    UNSPECIFIED REACTION    Mango Flavor [flavoring Agent] Rash, Other (See Comments)   Mango skin per pt- he has no allergy to mango flavoring       Medication List       Accurate as of January 19, 2021  8:34 AM. If you have any  questions, ask your nurse or doctor.        Accu-Chek Guide test strip Generic drug: glucose blood Twice daily   Accu-Chek Softclix Lancets lancets twice daily   aspirin EC 81 MG tablet Take 81 mg by mouth daily. Swallow whole.   atorvastatin 10 MG tablet Commonly known as: LIPITOR Take 1 tablet (10 mg total) by mouth daily. Started by: Gregory Sciara, MD   blood glucose meter kit and supplies Kit Dispense based on patient and insurance preference. Use up to four times daily as directed. (FOR ICD-9 250.00, 250.01).   FreeStyle [place] 2 [employer] as directed   [employer] Use as directed   metFORMIN 500 MG tablet Commonly known as: GLUCOPHAGE Take 1 tablet (500 mg total) by mouth 2 (two) times daily with a meal.        OBJECTIVE:   Vital Signs: BP 140/86   Pulse (!) 56   Ht 5' 10.5" (1.791 m)   Wt 223 lb (101.2 kg)   SpO2 97%   BMI 31.54 kg/m   Wt Readings from Last 3 Encounters:  01/19/21 223 lb (101.2 kg)  01/11/21 222 lb (100.7 kg)  12/21/20 227 lb (103 kg)     Exam: General: Pt appears well and is in NAD  Lungs: Clear with good BS bilat   Heart: RRR   Abdomen: Normoactive bowel sounds, soft, nontender, without masses or organomegaly palpable  Extremities: No pretibial edema.   Neuro: MS is good with appropriate affect, pt is alert and Ox3    DM foot exam: 11/02/2020  The skin of the feet is intact without sores or ulcerations. The pedal pulses are 2+ on right and 2+ on left. The sensation is decreased to a screening 5.07, 10 gram monofilament bilaterally    DATA REVIEWED:  Lab Results  Component Value Date   HGBA1C 5.3 01/19/2021   HGBA1C 6.6 (A) 11/02/2020   HGBA1C 8.6 (H) 08/19/2020   Lab Results  Component Value Date   MICROALBUR <0.7 12/21/2020   LDLCALC 90 11/02/2020   CREATININE 0.93 11/02/2020   Lab Results  Component Value Date   MICRALBCREAT 1.3 12/21/2020     Lab Results  Component  Value Date   CHOL 168 11/02/2020   HDL 40.70 11/02/2020   LDLCALC 90 11/02/2020   LDLDIRECT 127.0 12/22/2014   TRIG 189.0 (H) 11/02/2020   CHOLHDL 4 11/02/2020         ASSESSMENT / PLAN / RECOMMENDATIONS:   1) Type 2 Diabetes Mellitus, Optimally  controlled, With Neuropathic  complications - Most recent A1c of 5.3 %. Goal A1c < 7.0 %.    - I have praised him on the good work he has done with an A1c down to 5.3 %  - Will reduce Metformin as below , his A1c does not need to be that low    MEDICATIONS:  Decrease Metformin 500 mg 1 tablet daily   EDUCATION / INSTRUCTIONS:  BG monitoring instructions: Patient is instructed to check his blood sugars 3 times a day, before meals   Call [facility] if: BG persistently < 70  . I reviewed the Rule of 15 for the treatment of hypoglycemia in detail with the patient. Literature supplied.   2) Diabetic complications:   Eye: Does not have known diabetic retinopathy.   Neuro/ Feet: Does  have known diabetic peripheral neuropathy .   Renal: Patient does not have known baseline CKD. He   is not on an ACEI/ARB at present.    3) Hypertriglyceridemia: Patient is not on a statin. I have recommended statin intake ,we discussed ADA recommendation and cardiovascular benefits of statin, lipid check today showed elevated Tg . Will offer statin therapy    Medication: Start Atorvastatin 10 mg daily          F/U in 4  Months    Signed electronically by: Mack Guise, MD  [facility]  [facility] [address] [place], [place] [address] Phone: [phone] FAX: [phone]   CC: Debbrah Alar, [address] [place] [address] Phone: [phone]  Fax: [phone]  Return to Endocrinology clinic as below: Future Appointments  Date Time Provider [place]  01/21/2021  8:30 AM Jackquline Denmark, MD [facility]  01/22/2021   8:00 AM Janene Harvey D, PT [facility]  01/27/2021  8:00 AM Artist Pais, PTA [facility]  01/28/2021  9:00 AM Valora Piccolo Barnabas Lister, RD [facility]

## 2021-01-19 ENCOUNTER — Other Ambulatory Visit: Payer: Self-pay

## 2021-01-19 ENCOUNTER — Encounter: Payer: Self-pay | Admitting: Internal Medicine

## 2021-01-19 ENCOUNTER — Ambulatory Visit: Payer: BLUE CROSS/BLUE SHIELD | Admitting: Internal Medicine

## 2021-01-19 VITALS — BP 140/86 | HR 56 | Ht 70.5 in | Wt 223.0 lb

## 2021-01-19 DIAGNOSIS — E1165 Type 2 diabetes mellitus with hyperglycemia: Secondary | ICD-10-CM

## 2021-01-19 DIAGNOSIS — E1142 Type 2 diabetes mellitus with diabetic polyneuropathy: Secondary | ICD-10-CM | POA: Insufficient documentation

## 2021-01-19 DIAGNOSIS — E785 Hyperlipidemia, unspecified: Secondary | ICD-10-CM | POA: Insufficient documentation

## 2021-01-19 LAB — POCT GLYCOSYLATED HEMOGLOBIN (HGB A1C): Hemoglobin A1C: 5.3 % (ref 4.0–5.6)

## 2021-01-19 MED ORDER — METFORMIN HCL 500 MG PO TABS: 500.0000 mg | ORAL_TABLET | Freq: Every day | ORAL | 3 refills | Status: DC

## 2021-01-19 MED ORDER — ATORVASTATIN CALCIUM 10 MG PO TABS: 10.0000 mg | ORAL_TABLET | Freq: Every day | ORAL | 3 refills | Status: DC

## 2021-01-19 NOTE — Patient Instructions (Addendum)
-   Keep up the Good Work ! - Decrease Metformin 500 mg,  To 1  Tablet daily  - Start Atorvastatin 10 mg daily

## 2021-01-20 ENCOUNTER — Telehealth: Payer: Self-pay | Admitting: Internal Medicine

## 2021-01-20 NOTE — Telephone Encounter (Signed)
MEDICATION: Free Style Libre 2 Sensors - 6 at a time  PHARMACY:  [facility] [address] in [place] CONTACTED [place] [facility]?  yes  IS THIS A 90 DAY SUPPLY : patient requests 6 at a time (possible yes)  IS PATIENT OUT OF MEDICATION:   IF NOT; HOW MUCH IS LEFT:   LAST APPOINTMENT DATE: @1 /02/2021  NEXT APPOINTMENT DATE:@Visit  date not found  DO WE HAVE YOUR PERMISSION TO LEAVE A DETAILED MESSAGE?: yes [phone]  OTHER COMMENTS:    **Let patient know to contact pharmacy at the end of the day to make sure medication is ready. **  ** Please notify patient to allow 48-72 hours to process**  **Encourage patient to contact the pharmacy for refills or they can request refills through [facility]**

## 2021-01-20 NOTE — Telephone Encounter (Signed)
No problems by me RG 

## 2021-01-21 ENCOUNTER — Encounter: Payer: Self-pay | Admitting: Gastroenterology

## 2021-01-21 ENCOUNTER — Ambulatory Visit (AMBULATORY_SURGERY_CENTER): Payer: BLUE CROSS/BLUE SHIELD | Admitting: Gastroenterology

## 2021-01-21 ENCOUNTER — Other Ambulatory Visit: Payer: Self-pay | Admitting: Gastroenterology

## 2021-01-21 ENCOUNTER — Other Ambulatory Visit: Payer: Self-pay

## 2021-01-21 VITALS — BP 142/81 | HR 58 | Temp 97.1°F | Resp 14 | Ht 70.5 in | Wt 222.0 lb

## 2021-01-21 DIAGNOSIS — K635 Polyp of colon: Secondary | ICD-10-CM

## 2021-01-21 DIAGNOSIS — Z86010 Personal history of colonic polyps: Secondary | ICD-10-CM | POA: Diagnosis not present

## 2021-01-21 DIAGNOSIS — D123 Benign neoplasm of transverse colon: Secondary | ICD-10-CM

## 2021-01-21 DIAGNOSIS — Z1211 Encounter for screening for malignant neoplasm of colon: Secondary | ICD-10-CM | POA: Diagnosis not present

## 2021-01-21 DIAGNOSIS — D120 Benign neoplasm of cecum: Secondary | ICD-10-CM

## 2021-01-21 MED ORDER — FREESTYLE LIBRE 2 SENSOR MISC: 5 refills | Status: DC

## 2021-01-21 MED ORDER — SODIUM CHLORIDE 0.9 % IV SOLN: 500.0000 mL | Freq: Once | INTRAVENOUS | Status: DC

## 2021-01-21 NOTE — Op Note (Signed)
[facility] Patient Name: [name] Procedure Date: 01/21/2021 8:21 AM MRN: [id number] Endoscopist: Jackquline Denmark , MD Age: 70 Referring MD:  Date of Birth: 05/16/1951 Gender: Male Account #: [id number] Procedure:                Colonoscopy Indications:              High risk colon cancer surveillance: Personal                            history of colonic polyps. FH of colonic polyps                            (dad age 56) Medicines:                Monitored Anesthesia Care Procedure:                Pre-Anesthesia Assessment:                           - Prior to the procedure, a History and Physical                            was performed, and patient medications and                            allergies were reviewed. The patient's tolerance of                            previous anesthesia was also reviewed. The risks                            and benefits of the procedure and the sedation                            options and risks were discussed with the patient.                            All questions were answered, and informed consent                            was obtained. Prior Anticoagulants: The patient has                            taken no previous anticoagulant or antiplatelet                            agents. ASA Grade Assessment: II - A patient with                            mild systemic disease. After reviewing the risks                            and benefits, the patient was deemed in  satisfactory condition to undergo the procedure.                           After obtaining informed consent, the colonoscope                            was passed under direct vision. Throughout the                            procedure, the patient's blood pressure, pulse, and                            oxygen saturations were monitored continuously. The                            Olympus CF-HQ190L [id number]) Colonoscope was                             introduced through the anus and advanced to the the                            cecum, identified by appendiceal orifice and                            ileocecal valve. The colonoscopy was performed                            without difficulty. The patient tolerated the                            procedure well. The quality of the bowel                            preparation was good. The ileocecal valve,                            appendiceal orifice, and rectum were photographed. Scope In: 8:25:55 AM Scope Out: 8:44:18 AM Scope Withdrawal Time: 0 hours 12 minutes 7 seconds  Total Procedure Duration: 0 hours 18 minutes 23 seconds  Findings:                 A 2 mm polyp was found in the cecum. The polyp was                            sessile. The polyp was removed with a cold biopsy                            forceps. Resection and retrieval were complete.                           A 6 mm polyp was found in the distal transverse                            colon. The polyp was  sessile. The polyp was removed                            with a cold snare. Resection and retrieval were                            complete.                           Multiple medium-mouthed diverticula were found in                            the sigmoid colon and rare in ascending colon.                           Non-bleeding internal hemorrhoids were found during                            retroflexion. The hemorrhoids were small.                           The exam was otherwise without abnormality on                            direct and retroflexion views. Complications:            No immediate complications. Estimated Blood Loss:     Estimated blood loss: none. Impression:               -Colonic polyps s/p polypectomy.                           -Moderate predominantly sigmoid diverticulosis.                           -The examination was otherwise normal on direct and                             retroflexion views. Recommendation:           - Patient has a contact number available for                            emergencies. The signs and symptoms of potential                            delayed complications were discussed with the                            patient. Return to normal activities tomorrow.                            Written discharge instructions were provided to the                            patient.                           -  High fiber diet.                           - Continue present medications.                           - Await pathology results.                           - Repeat colonoscopy for surveillance based on                            pathology results.                           - The findings and recommendations were discussed                            with the patient. Jackquline Denmark, MD 01/21/2021 8:48:57 AM This report has been signed electronically.

## 2021-01-21 NOTE — Telephone Encounter (Signed)
Refill sent.

## 2021-01-21 NOTE — Patient Instructions (Signed)
Information on polyps, diverticulosis and hemorrhoids given to you today.  Await pathology results.  Resume previous diet and medications.  YOU HAD AN ENDOSCOPIC PROCEDURE TODAY AT THE [facility]:   Refer to the procedure report that was given to you for any specific questions about what was found during the examination.  If the procedure report does not answer your questions, please call your gastroenterologist to clarify.  If you requested that your care partner not be given the details of your procedure findings, then the procedure report has been included in a sealed envelope for you to review at your convenience later.  YOU SHOULD EXPECT: Some feelings of bloating in the abdomen. Passage of more gas than usual.  Walking can help get rid of the air that was put into your GI tract during the procedure and reduce the bloating. If you had a lower endoscopy (such as a colonoscopy or flexible sigmoidoscopy) you may notice spotting of blood in your stool or on the toilet paper. If you underwent a bowel prep for your procedure, you may not have a normal bowel movement for a few days.  Please Note:  You might notice some irritation and congestion in your nose or some drainage.  This is from the oxygen used during your procedure.  There is no need for concern and it should clear up in a day or so.  SYMPTOMS TO REPORT IMMEDIATELY:   Following lower endoscopy (colonoscopy or flexible sigmoidoscopy):  Excessive amounts of blood in the stool  Significant tenderness or worsening of abdominal pains  Swelling of the abdomen that is new, acute  Fever of 100F or higher   For urgent or emergent issues, a gastroenterologist can be reached at any hour by calling [phone]. Do not use MyChart messaging for urgent concerns.    DIET:  We do recommend a small meal at first, but then you may proceed to your regular diet.  Drink plenty of fluids but you should avoid alcoholic beverages for 24  hours.  ACTIVITY:  You should plan to take it easy for the rest of today and you should NOT DRIVE or use heavy machinery until tomorrow (because of the sedation medicines used during the test).    FOLLOW UP: Our staff will call the number listed on your records 48-72 hours following your procedure to check on you and address any questions or concerns that you may have regarding the information given to you following your procedure. If we do not reach you, we will leave a message.  We will attempt to reach you two times.  During this call, we will ask if you have developed any symptoms of COVID 19. If you develop any symptoms (ie: fever, flu-like symptoms, shortness of breath, cough etc.) before then, please call [phone].  If you test positive for Covid 19 in the 2 weeks post procedure, please call and report this information to us.    If any biopsies were taken you will be contacted by phone or by letter within the next 1-3 weeks.  Please call us at [phone] if you have not heard about the biopsies in 3 weeks.    SIGNATURES/CONFIDENTIALITY: You and/or your care partner have signed paperwork which will be entered into your electronic medical record.  These signatures attest to the fact that that the information above on your After Visit Summary has been reviewed and is understood.  Full responsibility of the confidentiality of this discharge information lies with you   and/or your care-partner. 

## 2021-01-21 NOTE — Progress Notes (Signed)
Report to PACU, RN, vss, BBS= Clear.  

## 2021-01-21 NOTE — Progress Notes (Signed)
Called to room to assist during endoscopic procedure.  Patient ID and intended procedure confirmed with present staff. Received instructions for my participation in the procedure from the performing physician.  

## 2021-01-21 NOTE — Progress Notes (Signed)
[facility]  Pt's states no medical or surgical changes since previsit or office visit.

## 2021-01-22 ENCOUNTER — Ambulatory Visit: Payer: BLUE CROSS/BLUE SHIELD | Admitting: Physical Therapy

## 2021-01-22 ENCOUNTER — Encounter: Payer: Self-pay | Admitting: Physical Therapy

## 2021-01-22 DIAGNOSIS — R293 Abnormal posture: Secondary | ICD-10-CM

## 2021-01-22 DIAGNOSIS — R29898 Other symptoms and signs involving the musculoskeletal system: Secondary | ICD-10-CM | POA: Diagnosis not present

## 2021-01-22 DIAGNOSIS — M6281 Muscle weakness (generalized): Secondary | ICD-10-CM

## 2021-01-22 NOTE — Therapy (Addendum)
[facility] [address]  [place], [place], [address] Phone: [phone]   Fax:  [phone]  Physical Therapy Treatment  Patient Details  Name: [name] MRN: [id number] Date of Birth: Apr 22, 1951 Referring Provider (PT): Debbrah Alar, NP   Encounter Date: 01/22/2021   PT End of Session - 01/22/21 0845    Visit Number 2    Number of Visits 5    Date for PT Re-Evaluation 02/12/21    Authorization Type [employer]    PT Start Time 0801    PT Stop Time 0845    PT Time Calculation (min) 44 min    Activity Tolerance Patient tolerated treatment well    Behavior During Therapy [facility] for tasks assessed/performed           Past Medical History:  Diagnosis Date  . Back pain    chronic  . Diabetes mellitus without complication ([place])   . GERD (gastroesophageal reflux disease)   . Hyperglycemia 08/20/2020  . Obesity   . OSA (obstructive sleep apnea)   . Sleep apnea    wears cpap     Past Surgical History:  Procedure Laterality Date  . COLONOSCOPY    . HEMANGIOMA EXCISION  1990   left leg  . HERNIA REPAIR    . KNEE SURGERY Right 2015  . ORIF RADIAL FRACTURE Left 07/20/2018   Procedure: LEFT ELBOW OPEN REDUCTION INTERNAL FIXATION (ORIF) AND REPAIR AS INDICATED, LIGAMENT AND BONE;  Surgeon: Iran Planas, MD;  Location: [facility];  Service: Orthopedics;  Laterality: Left;  . POLYPECTOMY     TA -x 1   . TOE SURGERY     remove ingrown toenail     There were no vitals filed for this visit.   Subjective Assessment - 01/22/21 0802    Subjective HEP is going fine. Denies questions/concerns.    Pertinent History DMII, hyperglycemia, GERD, back pain, R knee surgery 2015, L radial ORIF 2019    Diagnostic tests 08/29/20 lumbar MRI: . 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 Moderate left neural foraminal narrowing at L3-4  and L4-5    Patient Stated Goals improve leg strength    Currently in Pain? No/denies                             [facility] Adult PT Treatment/Exercise - 01/22/21 0001      Exercises   Exercises Knee/Hip;Lumbar      Lumbar Exercises: Stretches   Other Lumbar Stretch Exercise R/L diagonal prayer stretch 5x5" with green pball      Lumbar Exercises: Prone   Other Prone Lumbar Exercises prone on elbows 10x3"   cues to avoid lifting hip bones     Knee/Hip Exercises: Stretches   Piriformis Stretch Left;2 reps;30 seconds    Piriformis Stretch Limitations fig 4 in supine with self-OP    Other Knee/Hip Stretches L supine KTOS 2x30"      Knee/Hip Exercises: Aerobic   Nustep L6x6 min (UEs/LEs)      Knee/Hip Exercises: Standing   Heel Raises Right;Left;1 set;10 reps    Heel Raises Limitations at TM rail   cues to avoid excessive UE support   Knee Flexion Strengthening;Right;Left;1 set;10 reps    Knee Flexion Limitations HS curl w/ red TB at TM rail    Hip Flexion Stengthening;Both;20 reps;2  sets    Hip Flexion Limitations resisted march w/ red TB    Wall Squat 1 set;10 reps    Wall Squat Limitations good form      Knee/Hip Exercises: Supine   Bridges Strengthening;1 set;10 reps    Bridges Limitations cues to avoid lumbar extension    Bridges with [employer] Strengthening;Both;1 set;10 reps    Bridges with Cl[employer] Strengthening;1 set;10 reps   green loop above knees                 PT Education - 01/22/21 0845    Education Details update to HEP; answered patient's questions on exercise form, best modes of exercise, HIIT    Person(s) Educated Patient    Methods Explanation;Demonstration;Tactile cues;Verbal cues;Handout    Comprehension Verbalized understanding;Returned demonstration            PT Short Term Goals - 01/22/21 0942      PT SHORT TERM GOAL #1   Title Patient to be independent with initial HEP.    Time 2    Period Weeks    Status  Achieved    Target Date 01/29/21             PT Long Term Goals - 01/22/21 0943      PT LONG TERM GOAL #1   Title Patient to be independent with advanced HEP.    Time 4    Period Weeks    Status On-going      PT LONG TERM GOAL #2   Title Patient to demonstrate B LE strength >/=4+/5.    Time 4    Period Weeks    Status On-going      PT LONG TERM GOAL #3   Title Patient to demonstrate no tightness in L piriformis with stretching.    Time 4    Period Weeks    Status On-going      PT LONG TERM GOAL #4   Title Patient to demonstrate lumbar AROM WFL and without pain limiting.    Time 4    Period Weeks    Status On-going      PT LONG TERM GOAL #5   Title Patient to report 70% improvement in frequency of LE weakness upon standing from prolonged sitting.    Time 4    Period Weeks    Status On-going                 Plan - 01/22/21 0846    Clinical Impression Statement Patient arrived to session without new complaints. Denies questions on HEP. Patient required cues to maintain neutral spine and avoid lumbar extension with bridges. Patient with good carryover, thus increased challenge with hip ABD/ADD bias. Initiated gentle hip stretching on L LE, which patient tolerated well.  Standing LE strengthening ther-ex was processed today. Patient demonstrated L lateral lean with resisted march, requiring cues to correct. Better form with HS curls. Also required cues to avoid excessive UE support with heel raises. Updated HEP with exercises that were well-tolerated  today. Patient reported understanding and without complaints at end of session.    Comorbidities DMII, hyperglycemia, GERD, back pain, R knee arthroscopy 2015, L radial ORIF 2019    PT Treatment/Interventions ADLs/Self Care Home Management;Cryotherapy;Electrical Stimulation;Moist Heat;Therapeutic exercise;Therapeutic activities;Functional mobility training;Stair training;Gait training;Ultrasound;Neuromuscular  re-education;Patient/family education;Manual techniques;Taping;Energy conservation;Dry needling;Passive range of motion    PT Next Visit Plan progress LE strengthening, lumbar extension and SBing AROM, L piriformis stretching    Consulted and Agree with  Plan of Care Patient           Patient will benefit from skilled therapeutic intervention in order to improve the following deficits and impairments:  Hypomobility,Decreased activity tolerance,Decreased strength,Improper body mechanics,Decreased range of motion,Impaired flexibility,Postural dysfunction  Visit Diagnosis: Other symptoms and signs involving the musculoskeletal system  Muscle weakness (generalized)  Abnormal posture     Problem List Patient Active Problem List   Diagnosis Date Noted  . Dyslipidemia 01/19/2021  . Type 2 diabetes mellitus with diabetic polyneuropathy, without long-term current use of insulin ([place]) 01/19/2021  . Type 2 diabetes mellitus with hyperglycemia, without long-term current use of insulin ([place]) 11/02/2020  . Hyperglycemia 08/20/2020  . Hypertriglyceridemia 12/23/2014  . Abnormal finding on CT scan 12/22/2014  . Skin lesion of face 12/22/2014  . Plantar wart of left foot 09/12/2013  . Onychomycosis 12/19/2012  . SHOULDER PAIN, LEFT, CHRONIC 06/15/2010  . MORBID OBESITY 08/31/2009  . INTERMITTENT VERTIGO 08/28/2009  . PALPITATIONS 05/08/2009  . Obstructive sleep apnea 08/09/2007  . GERD 08/09/2007      Janene Harvey, PT, DPT 01/22/21 9:49 AM   [facility] [address]  [place], [place], [address] Phone: [phone]   Fax:  [phone]  Name: [name] MRN: [id number] Date of Birth: 1951-04-15  PHYSICAL THERAPY DISCHARGE SUMMARY  Visits from Start of Care: 2  Current functional level related to goals / functional outcomes: Unable to assess; patient requested hold for PT d/t lapse in insurance     Remaining deficits: Unable to assess   Education / Equipment: HEP  Plan: Patient agrees to discharge.  Patient goals were not met. Patient is being discharged due to the patient's request.  ?????     Janene Harvey, PT, DPT 03/18/21 3:25 PM

## 2021-01-24 ENCOUNTER — Encounter: Payer: Self-pay | Admitting: Family

## 2021-01-24 DIAGNOSIS — E1165 Type 2 diabetes mellitus with hyperglycemia: Secondary | ICD-10-CM

## 2021-01-24 DIAGNOSIS — E785 Hyperlipidemia, unspecified: Secondary | ICD-10-CM

## 2021-01-25 ENCOUNTER — Telehealth: Payer: Self-pay

## 2021-01-25 NOTE — Telephone Encounter (Signed)
  Follow up Call-  Call back number 01/21/2021  Post procedure Call Back phone  # [phone]  Permission to leave phone message Yes  Some recent data might be hidden     Patient questions:  Do you have a fever, pain , or abdominal swelling? No. Pain Score  0 *  Have you tolerated food without any problems? Yes.    Have you been able to return to your normal activities? Yes.    Do you have any questions about your discharge instructions: Diet   No. Medications  No. Follow up visit  No.  Do you have questions or concerns about your Care? No.  Actions: * If pain score is 4 or above: No action needed, pain <4.  1. Have you developed a fever since your procedure? no  2.   Have you had an respiratory symptoms (SOB or cough) since your procedure? no  3.   Have you tested positive for COVID 19 since your procedure no  4.   Have you had any family members/close contacts diagnosed with the COVID 19 since your procedure?  no   If yes to any of these questions please route to Joylene John, RN and Joella Prince, RN

## 2021-01-26 ENCOUNTER — Encounter: Payer: Self-pay | Admitting: Internal Medicine

## 2021-01-26 ENCOUNTER — Other Ambulatory Visit: Payer: Self-pay | Admitting: Internal Medicine

## 2021-01-26 ENCOUNTER — Other Ambulatory Visit: Payer: Self-pay

## 2021-01-26 ENCOUNTER — Other Ambulatory Visit (INDEPENDENT_AMBULATORY_CARE_PROVIDER_SITE_OTHER): Payer: BLUE CROSS/BLUE SHIELD

## 2021-01-26 DIAGNOSIS — E1165 Type 2 diabetes mellitus with hyperglycemia: Secondary | ICD-10-CM | POA: Diagnosis not present

## 2021-01-26 DIAGNOSIS — E785 Hyperlipidemia, unspecified: Secondary | ICD-10-CM | POA: Diagnosis not present

## 2021-01-26 LAB — LIPID PANEL
Cholesterol: 179 mg/dL (ref 0–200)
HDL: 42.1 mg/dL (ref >39.00)
LDL Cholesterol: 114 mg/dL — ABNORMAL HIGH (ref 0–99)
NonHDL: 137.29
Total CHOL/HDL Ratio: 4
Triglycerides: 117 mg/dL (ref 0.0–149.0)
VLDL: 23.4 mg/dL (ref 0.0–40.0)

## 2021-01-26 LAB — COMPREHENSIVE METABOLIC PANEL
ALT: 13 U/L (ref 0–53)
AST: 17 U/L (ref 0–37)
Albumin: 4.2 g/dL (ref 3.5–5.2)
Alkaline Phosphatase: 67 U/L (ref 39–117)
BUN: 19 mg/dL (ref 6–23)
CO2: 30 mEq/L (ref 19–32)
Calcium: 9.2 mg/dL (ref 8.4–10.5)
Chloride: 102 mEq/L (ref 96–112)
Creatinine, Ser: 0.82 mg/dL (ref 0.40–1.50)
GFR: 90.33 mL/min (ref >60.00)
Glucose, Bld: 120 mg/dL — ABNORMAL HIGH (ref 70–99)
Potassium: 4.1 mEq/L (ref 3.5–5.1)
Sodium: 138 mEq/L (ref 135–145)
Total Bilirubin: 1 mg/dL (ref 0.2–1.2)
Total Protein: 7 g/dL (ref 6.0–8.3)

## 2021-01-26 MED ORDER — METFORMIN HCL 500 MG PO TABS: 500.0000 mg | ORAL_TABLET | Freq: Two times a day (BID) | ORAL | 3 refills | Status: DC

## 2021-01-27 ENCOUNTER — Ambulatory Visit: Payer: BLUE CROSS/BLUE SHIELD

## 2021-01-28 ENCOUNTER — Encounter: Payer: Self-pay | Admitting: Dietician

## 2021-01-28 ENCOUNTER — Encounter: Payer: BLUE CROSS/BLUE SHIELD | Attending: Internal Medicine | Admitting: Dietician

## 2021-01-28 ENCOUNTER — Other Ambulatory Visit: Payer: Self-pay

## 2021-01-28 DIAGNOSIS — E785 Hyperlipidemia, unspecified: Secondary | ICD-10-CM | POA: Diagnosis not present

## 2021-01-28 DIAGNOSIS — E1165 Type 2 diabetes mellitus with hyperglycemia: Secondary | ICD-10-CM | POA: Diagnosis present

## 2021-01-28 NOTE — Patient Instructions (Signed)
Consider decreasing cheese to 1 oz daily. Continue to stay active. Continue mindfulness.  Choices  Portions  Satiety Stress control Quality sleep

## 2021-01-28 NOTE — Progress Notes (Signed)
Diabetes Self-Management Education  Visit Type: Follow-up  Appt. Start Time: 0915 Appt. End Time: 1020  01/28/2021  [name], identified by name and date of birth, is a 70 y.o. male with a diagnosis of Diabetes:  .   ASSESSMENT Patient is here today alone.  He was last seen by another RD at our office 09/02/2020 dx with newly diagnosed diabetes at that time. Patient wished to follow up with a CDCES. States that he has reduced his stress to manageable levels. He has been following a 1400 calorie, 80 gram protein diet with 32 grams of fiber and 140-150 grams of carbohydrates daily. He was living at a hotel and eating out for every meal prior to the diabetes diagnosis and now makes all of his meals from scratch. He weighs his food and tracks his intake.  Patient sent a list of questions via My Chart earlier in the week which we addressed.  Diabetes is a progressive illness.  It can go into remission (A1C WNL without medication) for some with lifestyle change and weight loss but remains part of your medical record and may return with resumption of previous habits and weight gain.  Current lipid tests and nutritional/exercise treatments.  Risks of cardiovascular complications, renal problems, retinopathy, neuropathy, and risk of amputations are reduced with lifestyle changes such as nutrition, exercise and good blood glucose control.  Weight loss/maintenace is also beneficial.  Goal weight per patient 200 lbs with considered low weight of 185 lbs.  How to maintain weight when goals achieved.  Effects of alcohol on triglycerides and blood glucose  Caffeine can increase blood sugar.  Caffeine may increase metabolism.  Animal vs plant proteins and recommendations  Intermittent fasting  Fiber goals and function  When to take Metformin to avoid side effects  Blood glucose post meal and difference from before to after meals  Blood glucose and dawn effect  Blood glucose and  exercise  Vitamin D  History includes:  Type 2 Diabetes (08/25/2020 with A1C of 8.6%), OSA-c-pap A1C 5.3% 01/19/2021 decreased from 8.6% 08/25/2021 Cholesterol 179, HDL 42, LDL 114, Triglycerides 117 01/26/2021 CGM:  FreeStyle Libre Medications include Metformin 500 mg bid  (decreased to once daily due to improvement in A1C but increased back to bid due to rising glucose and patient request) Sleep:  Sleeps 5-6 hours per night.  Feels well with this and states that he has usually not required more.  Weight: 215 lbs 01/28/2021 270 lbs 07/2021  Exercise: Walking 1 1/2 to 2 1/4 miles daily plus 50 minutes of resistance and hand weights increased from a very sedentary lifestyle.    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  He hope to retire soon.   Diabetes Self-Management Education - 01/28/21 1821      Visit Information   Visit Type Follow-up      Psychosocial Assessment   Patient Belief/Attitude about Diabetes Motivated to manage diabetes    Self-care barriers None    Other persons present Patient    Learning Readiness Ready    How often do you need to have someone help you when you read instructions, pamphlets, or other written materials from your doctor or pharmacy? 1 - Never      Pre-Education Assessment   Patient understands the diabetes disease and treatment process. Needs Review    Patient understands incorporating nutritional management into  lifestyle. Needs Review    Patient undertands incorporating physical activity into lifestyle. Demonstrates understanding / competency    Patient understands using medications safely. Needs Review    Patient understands monitoring blood glucose, interpreting and using results Demonstrates understanding / competency    Patient understands prevention, detection, and treatment of acute complications. Demonstrates  understanding / competency    Patient understands prevention, detection, and treatment of chronic complications. Needs Review    Patient understands how to develop strategies to address psychosocial issues. Demonstrates understanding / competency    Patient understands how to develop strategies to promote health/change behavior. Needs Review      Complications   Last HgB A1C per patient/outside source 5.3 %   01/19/2021 dercreased from 8.6% 07/2020   How often do you check your blood sugar? > 4 times/day    Fasting Blood glucose range (mg/dL) 130-179;70-129   to140   Postprandial Blood glucose range (mg/dL) 130-179    Number of hypoglycemic episodes per month 0    Number of hyperglycemic episodes per week 0      Dietary Intake   Breakfast egg beater omelet with 1 oz cheese, vegetables, 2 slices [employer] bread, 1/2 pear    Lunch large salad with increased variety of vegetables, 2 oz chicken, balsamic vinegrette    Snack (afternoon) 1/2 apple    Dinner [employer] spaghetti with 2 oz meatballs, vegetables    Beverage(s) water      Exercise   Exercise Type Light (walking / raking leaves)    How many days per week to you exercise? 7    How many minutes per day do you exercise? 90    Total minutes per week of exercise 630      Patient Education   Previous Diabetes Education Yes (please comment)   08/2020   Disease state  Other (comment)   questions answered   Nutrition management  Other (comment)   questions answered   Medications Reviewed patients medication for diabetes, action, purpose, timing of dose and side effects.    Monitoring Other (comment)   questions answered   Chronic complications Relationship between chronic complications and blood glucose control    Psychosocial adjustment Role of stress on diabetes      Individualized Goals (developed by patient)   Nutrition Follow meal plan discussed    Physical Activity Exercise 5-7 days per week;60 minutes per day    Medications take my  medication as prescribed    Monitoring  test my blood glucose as discussed    Reducing Risk examine blood glucose patterns      Patient Self-Evaluation of Goals - Patient rates self as meeting previously set goals (% of time)   Nutrition >75%    Physical Activity >75%    Medications >75%    Monitoring >75%    Problem Solving >75%    Reducing Risk >75%    Health Coping >75%      Post-Education Assessment   Patient understands the diabetes disease and treatment process. Demonstrates understanding / competency    Patient understands incorporating nutritional management into lifestyle. Demonstrates understanding / competency    Patient undertands incorporating physical activity into lifestyle. Demonstrates understanding / competency    Patient understands using medications safely. Demonstrates understanding / competency    Patient understands monitoring blood glucose, interpreting and using results Demonstrates understanding / competency    Patient understands prevention, detection, and treatment of acute complications. Demonstrates understanding / competency    Patient understands prevention,  detection, and treatment of chronic complications. Demonstrates understanding / competency    Patient understands how to develop strategies to address psychosocial issues. Demonstrates understanding / competency    Patient understands how to develop strategies to promote health/change behavior. Demonstrates understanding / competency      Outcomes   Expected Outcomes Demonstrated interest in learning. Expect positive outcomes    Future DMSE PRN    Program Status Completed      Subsequent Visit   Since your last visit have you experienced any weight changes? Loss    Weight Loss (lbs) 55    Since your last visit, are you checking your blood glucose at least once a day? Yes           Individualized Plan for Diabetes Self-Management Training:   Learning Objective:  Patient will have a greater  understanding of diabetes self-management. Patient education plan is to attend individual and/or group sessions per assessed needs and concerns.   Plan:   Patient Instructions  Consider decreasing cheese to 1 oz daily. Continue to stay active. Continue mindfulness.  Choices  Portions  Satiety Stress control Quality sleep    Expected Outcomes:  Demonstrated interest in learning. Expect positive outcomes  Education material provided: Diabetes Resources; cholesterol and triglycerides, American College of Cardiology 2021 [facility] Dietary Guidelines for Cardiovascular Health, Reversing Type 2 Diabetes:  A Narrative review of the Evidence from Nutrients  If problems or questions, patient to contact team via:  Phone  Future DSME appointment: PRN

## 2021-02-02 ENCOUNTER — Ambulatory Visit: Payer: BLUE CROSS/BLUE SHIELD | Admitting: Physical Therapy

## 2021-02-03 ENCOUNTER — Ambulatory Visit: Payer: BLUE CROSS/BLUE SHIELD | Admitting: Internal Medicine

## 2021-02-04 ENCOUNTER — Encounter: Payer: Self-pay | Admitting: Gastroenterology

## 2021-04-16 ENCOUNTER — Telehealth: Payer: Self-pay | Admitting: Family

## 2021-04-16 NOTE — Telephone Encounter (Signed)
Patient states he has sleep apnea and his CPAD machine is broken. He wants to know what he needs to do to get a replacement.

## 2021-04-19 ENCOUNTER — Telehealth: Payer: Self-pay | Admitting: Internal Medicine

## 2021-04-19 ENCOUNTER — Encounter: Payer: Self-pay | Admitting: Internal Medicine

## 2021-04-19 ENCOUNTER — Ambulatory Visit (INDEPENDENT_AMBULATORY_CARE_PROVIDER_SITE_OTHER): Payer: Medicare Other | Admitting: Internal Medicine

## 2021-04-19 ENCOUNTER — Other Ambulatory Visit: Payer: Self-pay

## 2021-04-19 VITALS — BP 122/70 | HR 62 | Ht 70.5 in | Wt 210.4 lb

## 2021-04-19 DIAGNOSIS — G4733 Obstructive sleep apnea (adult) (pediatric): Secondary | ICD-10-CM | POA: Diagnosis not present

## 2021-04-19 NOTE — Patient Instructions (Signed)
[place]- DME [facility]   please service CPAP machine- pressure  varies. Replace if eligible.

## 2021-04-19 NOTE — Progress Notes (Signed)
04/19/21- [name] never smoker for sleep evaluation- self referral with concern of OSA Medical problem list includes  OSA- CPAP discontinued in pst due to "inconvenience", GERD, DM2/ Neuropathy, Morbid Obesity, Hyperlipidemia, Covid infection April 2022,  NPSG 03/14/07- AHI 41/ hr, desaturation to 87%, body weight 282 lbs NPSG 09/04/14- AHI 29.9/ hr, desaturation to 85%, CPAP to 13, body weight 285 lbs Epworth score-3 Body weight today-210 lbs Covid  vax- CPAP- autopap  [employer] Manages large apartment complex. Had Covid April 2022, gradually less fatigued from this.  Has lost 72 lbs with sustained diet and exercise. Wants to wait for now on updating home sleep test, but does wonder how much difference weight loss has made. He isn't sure machine is adjusting pressures right now. Wants machine servided and supply script renewed. Denies ENT surgery  Prior to Admission medications   Medication Sig Start Date End Date Taking? Authorizing Provider  aspirin EC 81 MG tablet Take 81 mg by mouth daily. Swallow whole.   Yes [provider]  Continuous Blood Gluc Sensor (FREESTYLE LIBRE 2 SENSOR) MISC Use as directed 01/21/21  Yes Shamleffer, Melanie Crazier, MD  glucose blood (ACCU-CHEK GUIDE) test strip Twice daily 11/02/20  Yes Shamleffer, Melanie Crazier, MD  metFORMIN (GLUCOPHAGE) 500 MG tablet Take 1 tablet (500 mg total) by mouth 2 (two) times daily with a meal. 01/26/21  Yes Shamleffer, Melanie Crazier, MD  Accu-Chek Softclix Lancets lancets twice daily Patient not taking: Reported on 04/19/2021 11/02/20   Shamleffer, Melanie Crazier, MD  atorvastatin (LIPITOR) 10 MG tablet Take 1 tablet (10 mg total) by mouth daily. Patient not taking: No sig reported 01/19/21   Shamleffer, Melanie Crazier, MD  blood glucose meter kit and supplies KIT Dispense based on patient and insurance preference. Use up to four times daily as directed. (FOR ICD-9 250.00, 250.01). 08/25/20   Debbrah Alar, NP   Past  Medical History:  Diagnosis Date   Back pain    chronic   Diabetes mellitus without complication ([facility])    GERD (gastroesophageal reflux disease)    Hyperglycemia 08/20/2020   Obesity    OSA (obstructive sleep apnea)    Sleep apnea    wears cpap    Past Surgical History:  Procedure Laterality Date   COLONOSCOPY     HEMANGIOMA EXCISION  1990   left leg   HERNIA REPAIR     KNEE SURGERY Right 2015   ORIF RADIAL FRACTURE Left 07/20/2018   Procedure: LEFT ELBOW OPEN REDUCTION INTERNAL FIXATION (ORIF) AND REPAIR AS INDICATED, LIGAMENT AND BONE;  Surgeon: Iran Planas, MD;  Location: [facility];  Service: Orthopedics;  Laterality: Left;   POLYPECTOMY     TA -x 1    TOE SURGERY     remove ingrown toenail    Family History  Problem Relation Age of Onset   Arthritis Mother        spine   Heart disease Mother    Hypertension Mother    Cancer Mother        bone   Heart disease Brother        murmur   Colon polyps Father    Colon cancer Neg Hx    Esophageal cancer Neg Hx    Rectal cancer Neg Hx    Stomach cancer Neg Hx    Social History   Socioeconomic History   Marital status: Single    Spouse name: Not on file   Number of children: Not on file   Years  of education: Not on file   Highest education level: Not on file  Occupational History   Not on file  Tobacco Use   Smoking status: Never   Smokeless tobacco: Never  Substance and Sexual Activity   Alcohol use: Not Currently   Drug use: No   Sexual activity: Not on file  Other Topics Concern   Not on file  Social History Narrative   Not on file   Social Determinants of Health   Financial Resource Strain: Not on file  Food Insecurity: Not on file  Transportation Needs: Not on file  Physical Activity: Not on file  Stress: Not on file  Social Connections: Not on file  Intimate Partner Violence: Not on file   ROS-see HPI   + = positive Constitutional:    weight loss, night sweats, fevers, chills, fatigue,  lassitude. HEENT:    headaches, difficulty swallowing, tooth/dental problems, sore throat,       sneezing, itching, ear ache, nasal congestion, post nasal drip, snoring CV:    chest pain, orthopnea, PND, swelling in lower extremities, anasarca,                                   dizziness, palpitations Resp:   shortness of breath with exertion or at rest.                productive cough,   non-productive cough, coughing up of blood.              change in color of mucus.  wheezing.   Skin:    rash or lesions. GI:  No-   heartburn, indigestion, abdominal pain, nausea, vomiting, diarrhea,                 change in bowel habits, loss of appetite GU: dysuria, change in color of urine, no urgency or frequency.   flank pain. MS:   joint pain, stiffness, decreased range of motion, back pain. Neuro-     nothing unusual Psych:  change in mood or affect.  depression or anxiety.   memory loss.  OBJ- Physical Exam General- Alert, Oriented, Affect-appropriate, Distress- none acute Skin- rash-none, lesions- none, excoriation- none Lymphadenopathy- none Head- atraumatic            Eyes- Gross vision intact, PERRLA, conjunctivae and secretions clear            Ears- Hearing, canals-normal            Nose- Clear, no-Sept-IIIal dev, mucus, polyps, erosion, perforation             Throat- Mallampati II , mucosa clear , drainage- none, tonsils- atrophic Neck- flexible , trachea midline, no stridor , thyroid nl, carotid no bruit Chest - symmetrical excursion , unlabored           Heart/CV- RRR , no murmur , no gallop  , no rub, nl s1 s2                           - JVD- none , edema- none, stasis changes- none, varices- none           Lung- clear to P&A, wheeze- none, cough- none , dullness-none, rub- none           Chest wall-  Abd-  Br/ Gen/ Rectal- Not done, not indicated Extrem- cyanosis- none, clubbing, none, atrophy-  none, strength- nl Neuro- grossly intact to observation

## 2021-04-19 NOTE — Assessment & Plan Note (Signed)
CPAP auto/ [facility]. He has had benefit. Les clear where things stand after weight loss. Discussed updating sleep study- he wants to wait and get machine serviced first. [place], renew supplies

## 2021-04-19 NOTE — Telephone Encounter (Signed)
Patient was advised to call his pulmonologist Dr Anne Shutter about this. Phone number was provided.

## 2021-04-19 NOTE — Assessment & Plan Note (Signed)
Has accomplished significant weight loss with diet and exercise, driven by dx of DM2.

## 2021-04-19 NOTE — Telephone Encounter (Signed)
error 

## 2021-04-30 ENCOUNTER — Ambulatory Visit: Payer: Medicare Other | Admitting: Family

## 2021-04-30 ENCOUNTER — Other Ambulatory Visit: Payer: Self-pay

## 2021-04-30 VITALS — BP 112/58 | HR 61 | Temp 98.2°F | Resp 16 | Ht 70.5 in | Wt 212.0 lb

## 2021-04-30 DIAGNOSIS — E1142 Type 2 diabetes mellitus with diabetic polyneuropathy: Secondary | ICD-10-CM | POA: Diagnosis not present

## 2021-04-30 DIAGNOSIS — S40862A Insect bite (nonvenomous) of left upper arm, initial encounter: Secondary | ICD-10-CM | POA: Diagnosis not present

## 2021-04-30 DIAGNOSIS — W57XXXA Bitten or stung by nonvenomous insect and other nonvenomous arthropods, initial encounter: Secondary | ICD-10-CM | POA: Diagnosis not present

## 2021-04-30 NOTE — Progress Notes (Signed)
Subjective:    Patient ID: [name], male    DOB: 1950/11/05, 70 y.o.   MRN: [id number]  HPI  Patient is a 70 yr old male who presents today to discuss a tick bite on his left arm. States that he saw the tick yesterday on the back of his left arm and he was easily able to remove the tick. He brings the insect with him today and it is alive in a ziplock. It looks like a dog tick and does not appear engorged. He denies fever, joint pain or rash.   Review of Systems See HPI  Past Medical History:  Diagnosis Date   Back pain    chronic   Diabetes mellitus without complication (HCC)    GERD (gastroesophageal reflux disease)    Hyperglycemia 08/20/2020   Obesity    OSA (obstructive sleep apnea)    Sleep apnea    wears cpap      Social History   Socioeconomic History   Marital status: Single    Spouse name: Not on file   Number of children: Not on file   Years of education: Not on file   Highest education level: Not on file  Occupational History   Not on file  Tobacco Use   Smoking status: Never   Smokeless tobacco: Never  Substance and Sexual Activity   Alcohol use: Not Currently   Drug use: No   Sexual activity: Not on file  Other Topics Concern   Not on file  Social History Narrative   Not on file   Social Determinants of Health   Financial Resource Strain: Not on file  Food Insecurity: Not on file  Transportation Needs: Not on file  Physical Activity: Not on file  Stress: Not on file  Social Connections: Not on file  Intimate Partner Violence: Not on file    Past Surgical History:  Procedure Laterality Date   COLONOSCOPY     [place]   left leg   HERNIA REPAIR     KNEE SURGERY Right 2015   ORIF RADIAL FRACTURE Left 07/20/2018   Procedure: LEFT ELBOW OPEN REDUCTION INTERNAL FIXATION (ORIF) AND REPAIR AS INDICATED, LIGAMENT AND BONE;  Surgeon: Iran Planas, MD;  Location: [facility];  Service: Orthopedics;  Laterality: Left;    POLYPECTOMY     TA -x 1    TOE SURGERY     remove ingrown toenail     Family History  Problem Relation Age of Onset   Arthritis Mother        spine   Heart disease Mother    Hypertension Mother    Cancer Mother        bone   Heart disease Brother        murmur   Colon polyps Father    Colon cancer Neg Hx    Esophageal cancer Neg Hx    Rectal cancer Neg Hx    Stomach cancer Neg Hx     Allergies  Allergen Reactions   Other Other (See Comments)    Anesthetic "pentathol" caused hallucinations over 48 hrs   Latex     UNSPECIFIED REACTION    Mango Flavor [Flavoring Agent] Rash and Other (See Comments)    Mango skin per pt- he has no allergy to mango flavoring     Current Outpatient Medications on File Prior to Visit  Medication Sig Dispense Refill   Accu-Chek Softclix Lancets lancets twice daily 100 each 12  aspirin EC 81 MG tablet Take 81 mg by mouth daily. Swallow whole.     blood glucose meter kit and supplies KIT Dispense based on patient and insurance preference. Use up to four times daily as directed. (FOR ICD-9 250.00, 250.01). 1 each 0   Continuous Blood Gluc Sensor (FREESTYLE LIBRE 2 SENSOR) MISC Use as directed 6 each 5   glucose blood (ACCU-CHEK GUIDE) test strip Twice daily 100 each 12   metFORMIN (GLUCOPHAGE) 500 MG tablet Take 1 tablet (500 mg total) by mouth 2 (two) times daily with a meal. 180 tablet 3   atorvastatin (LIPITOR) 10 MG tablet Take 1 tablet (10 mg total) by mouth daily. (Patient not taking: No sig reported) 90 tablet 3   No current facility-administered medications on file prior to visit.    BP (!) 112/58 (BP Location: Right Arm, Patient Position: Sitting, Cuff Size: Small)   Pulse 61   Temp 98.2 F (36.8 C) (Oral)   Resp 16   Ht 5' 10.5" (1.791 m)   Wt 212 lb (96.2 kg)   SpO2 98%   BMI 29.99 kg/m       Objective:   Physical Exam Constitutional:      Appearance: Normal appearance.  Skin:    General: Skin is warm and dry.      Comments: No lesion, no rash noted at or around bite site  Neurological:     Mental Status: He is alert.          Assessment & Plan:

## 2021-04-30 NOTE — Assessment & Plan Note (Signed)
Lab Results  Component Value Date   HGBA1C 5.3 01/19/2021   He has lost about 75 pounds and is now only on metformin. This is being managed by Endocrinology. I commended the patient on his good work.

## 2021-04-30 NOTE — Assessment & Plan Note (Signed)
New.  We discussed signs/symptoms of lyme disease and advised pt to call if he develops any of these symptoms.  Reassurance provided at this time.  25 minutes spent on today's visit. The majority of this time was spent counseling the patient about tick bites and his diabetes.

## 2021-08-19 ENCOUNTER — Ambulatory Visit: Payer: Medicare Other | Admitting: Internal Medicine

## 2021-10-01 ENCOUNTER — Encounter: Payer: Self-pay | Admitting: Family

## 2021-10-01 ENCOUNTER — Ambulatory Visit: Payer: Medicare Other | Admitting: Family

## 2021-10-01 VITALS — BP 142/70 | HR 61 | Temp 97.6°F | Ht 70.0 in | Wt 218.6 lb

## 2021-10-01 DIAGNOSIS — R21 Rash and other nonspecific skin eruption: Secondary | ICD-10-CM

## 2021-10-01 MED ORDER — PREDNISONE 20 MG PO TABS: 20.0000 mg | ORAL_TABLET | Freq: Every day | ORAL | 0 refills | Status: DC

## 2021-10-01 MED ORDER — HYDROXYZINE HCL 10 MG PO TABS: 10.0000 mg | ORAL_TABLET | Freq: Three times a day (TID) | ORAL | 0 refills | Status: DC | PRN

## 2021-10-01 MED ORDER — FAMOTIDINE 20 MG PO TABS: 20.0000 mg | ORAL_TABLET | Freq: Two times a day (BID) | ORAL | 0 refills | Status: DC

## 2021-10-01 NOTE — Progress Notes (Signed)
[name] is a 70 y.o. male with the following history as recorded in [employer]:  Patient Active Problem List   Diagnosis Date Noted   Tick bite of left upper arm 04/30/2021   Dyslipidemia 01/19/2021   Type 2 diabetes mellitus with diabetic polyneuropathy, without long-term current use of insulin ([place]) 01/19/2021   Type 2 diabetes mellitus with hyperglycemia, without long-term current use of insulin ([place]) 11/02/2020   Hyperglycemia 08/20/2020   Hypertriglyceridemia 12/23/2014   Abnormal finding on CT scan 12/22/2014   Skin lesion of face 12/22/2014   Plantar wart of left foot 09/12/2013   Onychomycosis 12/19/2012   SHOULDER PAIN, LEFT, CHRONIC 06/15/2010   MORBID OBESITY 08/31/2009   INTERMITTENT VERTIGO 08/28/2009   PALPITATIONS 05/08/2009   Obstructive sleep apnea 08/09/2007   GERD 08/09/2007    Current Outpatient Medications  Medication Sig Dispense Refill   Accu-Chek Softclix Lancets lancets twice daily 100 each 12   aspirin EC 81 MG tablet Take 81 mg by mouth daily. Swallow whole.     blood glucose meter kit and supplies KIT Dispense based on patient and insurance preference. Use up to four times daily as directed. (FOR ICD-9 250.00, 250.01). 1 each 0   Continuous Blood Gluc Sensor (FREESTYLE LIBRE 2 SENSOR) MISC Use as directed 6 each 5   famotidine (PEPCID) 20 MG tablet Take 1 tablet (20 mg total) by mouth 2 (two) times daily. 40 tablet 0   glucose blood (ACCU-CHEK GUIDE) test strip Twice daily 100 each 12   hydrOXYzine (ATARAX) 10 MG tablet Take 1 tablet (10 mg total) by mouth 3 (three) times daily as needed for itching. 30 tablet 0   metFORMIN (GLUCOPHAGE) 500 MG tablet Take 1 tablet (500 mg total) by mouth 2 (two) times daily with a meal. 180 tablet 3   predniSONE (DELTASONE) 20 MG tablet Take 1 tablet (20 mg total) by mouth daily with breakfast. 5 tablet 0   atorvastatin (LIPITOR) 10 MG tablet Take 1 tablet (10 mg total) by mouth daily. (Patient not taking: Reported  on 01/21/2021) 90 tablet 3   No current facility-administered medications for this visit.    Allergies: Other, Latex, and Mango flavor [flavoring agent]  Past Medical History:  Diagnosis Date   Back pain    chronic   Diabetes mellitus without complication (HCC)    GERD (gastroesophageal reflux disease)    Hyperglycemia 08/20/2020   Obesity    OSA (obstructive sleep apnea)    Sleep apnea    wears cpap     Past Surgical History:  Procedure Laterality Date   COLONOSCOPY     HEMANGIOMA EXCISION  1990   left leg   HERNIA REPAIR     KNEE SURGERY Right 2015   ORIF RADIAL FRACTURE Left 07/20/2018   Procedure: LEFT ELBOW OPEN REDUCTION INTERNAL FIXATION (ORIF) AND REPAIR AS INDICATED, LIGAMENT AND BONE;  Surgeon: Iran Planas, MD;  Location: [facility];  Service: Orthopedics;  Laterality: Left;   POLYPECTOMY     TA -x 1    TOE SURGERY     remove ingrown toenail     Family History  Problem Relation Age of Onset   Arthritis Mother        spine   Heart disease Mother    Hypertension Mother    Cancer Mother        bone   Heart disease Brother        murmur   Colon polyps Father    Colon  cancer Neg Hx    Esophageal cancer Neg Hx    Rectal cancer Neg Hx    Stomach cancer Neg Hx     Social History   Tobacco Use   Smoking status: Never   Smokeless tobacco: Never  Substance Use Topics   Alcohol use: Not Currently    Subjective:  Presents with concerns for rash on upper and lower extremities x 1 1/2 week; denies any new soaps, foods, detergents or medications.  Admits stress level is extremely high- in the process of moving to [place], [place]; moving in the next week; has been packing boxes that have been in storage x 20 years; Notes that rash is not itching; does feel that rash seems to "come and go" and vary in intensity;        Objective:  Vitals:   10/01/21 1532  BP: (!) 142/70  Pulse: 61  Temp: 97.6 F (36.4 C)  TempSrc: Oral  SpO2: 95%  Weight: 218 lb 9.6 oz  (99.2 kg)  Height: 5' 10"  (1.778 m)    General: Well developed, well nourished, in no acute distress  Skin : Warm and dry. Erythema noted over entire upper trunk; petechiae noted on lower extremities bilaterally- right localized at upper leg and left lower calf;  Head: Normocephalic and atraumatic  Lungs: Respirations unlabored; clear to auscultation bilaterally without wheeze, rales, rhonchi  Neurologic: Alert and oriented; speech intact; face symmetrical; moves all extremities well; CNII-XII intact without focal deficit   Assessment:  1. Rash     Plan:   ? Urticaria secondary to stress or contact dermatitis from dust exposure; will check CBC, CMP today; Rx for Prednisone 20 mg qd x 5 days, hydroxyzine and pepcid;  Follow up to be determined- moving to [facility] in 1 week;   Patient specifically asks that Hgba1c not be checked today;   This visit occurred during the SARS-CoV-2 public health emergency.  Safety protocols were in place, including screening questions prior to the visit, additional usage of staff PPE, and extensive cleaning of exam room while observing appropriate contact time as indicated for disinfecting solutions.   No follow-ups on file.  Orders Placed This Encounter  Procedures   CBC with Differential/Platelet   Comp Met (CMET)    Requested Prescriptions   Signed Prescriptions Disp Refills   predniSONE (DELTASONE) 20 MG tablet 5 tablet 0    Sig: Take 1 tablet (20 mg total) by mouth daily with breakfast.   hydrOXYzine (ATARAX) 10 MG tablet 30 tablet 0    Sig: Take 1 tablet (10 mg total) by mouth 3 (three) times daily as needed for itching.   famotidine (PEPCID) 20 MG tablet 40 tablet 0    Sig: Take 1 tablet (20 mg total) by mouth 2 (two) times daily.

## 2021-10-02 LAB — CBC WITH DIFFERENTIAL/PLATELET
Absolute Monocytes: 535 cells/uL (ref 200–950)
Basophils Absolute: 73 cells/uL (ref 0–200)
Basophils Relative: 0.9 %
Eosinophils Absolute: 478 cells/uL (ref 15–500)
Eosinophils Relative: 5.9 %
HCT: 45.6 % (ref 38.5–50.0)
Hemoglobin: 15.5 g/dL (ref 13.2–17.1)
Lymphs Abs: 2066 cells/uL (ref 850–3900)
MCH: 30.3 pg (ref 27.0–33.0)
MCHC: 34 g/dL (ref 32.0–36.0)
MCV: 89.2 fL (ref 80.0–100.0)
MPV: 10.3 fL (ref 7.5–12.5)
Monocytes Relative: 6.6 %
Neutro Abs: 4949 cells/uL (ref 1500–7800)
Neutrophils Relative %: 61.1 %
Platelets: 200 10*3/uL (ref 140–400)
RBC: 5.11 10*6/uL (ref 4.20–5.80)
RDW: 12.9 % (ref 11.0–15.0)
Total Lymphocyte: 25.5 %
WBC: 8.1 10*3/uL (ref 3.8–10.8)

## 2021-10-02 LAB — COMPREHENSIVE METABOLIC PANEL
AG Ratio: 1.7 (calc) (ref 1.0–2.5)
ALT: 8 U/L — ABNORMAL LOW (ref 9–46)
AST: 16 U/L (ref 10–35)
Albumin: 4.3 g/dL (ref 3.6–5.1)
Alkaline phosphatase (APISO): 65 U/L (ref 35–144)
BUN: 19 mg/dL (ref 7–25)
CO2: 23 mmol/L (ref 20–32)
Calcium: 9.2 mg/dL (ref 8.6–10.3)
Chloride: 103 mmol/L (ref 98–110)
Creat: 0.84 mg/dL (ref 0.70–1.35)
Globulin: 2.6 g/dL (calc) (ref 1.9–3.7)
Glucose, Bld: 95 mg/dL (ref 65–99)
Potassium: 4.1 mmol/L (ref 3.5–5.3)
Sodium: 139 mmol/L (ref 135–146)
Total Bilirubin: 0.9 mg/dL (ref 0.2–1.2)
Total Protein: 6.9 g/dL (ref 6.1–8.1)

## 2022-02-16 ENCOUNTER — Telehealth: Payer: Self-pay | Admitting: Family

## 2022-02-16 NOTE — Telephone Encounter (Signed)
Called patient back a few times but no answer and voice mail is full. Per Dr. Jake Church she is not able to prescribe his new monitor due to not being seen in over one year.  ?He will need follow up appointment with Dr. Jake Church and Lenna Sciara when he is back in town to get cut up on his visits. He can schedule now if he knows when he will be back.  ?Dr. Ples Specter phone number is [phone].  ?

## 2022-02-16 NOTE — Telephone Encounter (Signed)
Pt would like to know if he can change to the freestyle 3 sensor. Please advise.  ? ?[name] [id number] - [place], [place]  ? [place], [place] [address]  ?Phone:  [phone]  Fax:  [phone]  ?

## 2022-02-18 ENCOUNTER — Other Ambulatory Visit: Payer: Self-pay | Admitting: Internal Medicine

## 2022-02-18 NOTE — Telephone Encounter (Signed)
Patient called and I let him know about message below  ?Patient stated he would call back and schedule appt when he is available ?

## 2022-04-27 ENCOUNTER — Other Ambulatory Visit: Payer: Self-pay | Admitting: Internal Medicine

## 2023-08-30 ENCOUNTER — Ambulatory Visit
Admission: EM | Admit: 2023-08-30 | Discharge: 2023-08-30 | Disposition: A | Discharge status: Home / Self Care | Payer: Medicare Other | Attending: Internal Medicine | Admitting: Internal Medicine

## 2023-08-30 DIAGNOSIS — S76112A Strain of left quadriceps muscle, fascia and tendon, initial encounter: Secondary | ICD-10-CM

## 2023-08-30 DIAGNOSIS — M79652 Pain in left thigh: Secondary | ICD-10-CM

## 2023-08-30 MED ORDER — METHOCARBAMOL 500 MG PO TABS: 500.0000 mg | ORAL_TABLET | Freq: Four times a day (QID) | ORAL | 0 refills | Status: DC

## 2023-08-30 NOTE — ED Provider Notes (Signed)
[facility]  Note:  This document was prepared using Conservation officer, historic buildings and may include unintentional dictation errors.  MRN: [id number] DOB: 1951/07/13  Subjective:   [name] is a 72 y.o. male presenting for 5 to 6-week history of persistent left thigh pain that borders the lateral aspect of the left knee.  Patient has had extensive workup and all has been negative.  Symptoms started after he was moving furniture.  He was prescribed meloxicam which does help.  He was also prescribed methocarbamol but is currently out.  He has been doing a lot of recent travel, standing and walking, long car rides and feels that he has now aggravated his pain again.  No current facility-administered medications for this encounter.  Current Outpatient Medications:    Accu-Chek Softclix Lancets lancets, twice daily, Disp: 100 each, Rfl: 12   aspirin EC 81 MG tablet, Take 81 mg by mouth daily. Swallow whole., Disp: , Rfl:    atorvastatin (LIPITOR) 10 MG tablet, Take 1 tablet (10 mg total) by mouth daily. (Patient not taking: Reported on 01/21/2021), Disp: 90 tablet, Rfl: 3   blood glucose meter kit and supplies KIT, Dispense based on patient and insurance preference. Use up to four times daily as directed. (FOR ICD-9 250.00, 250.01)., Disp: 1 each, Rfl: 0   Continuous Blood Gluc Sensor (FREESTYLE LIBRE 2 SENSOR) MISC, USE AS DIRECTED, Disp: 6 each, Rfl: 0   famotidine (PEPCID) 20 MG tablet, Take 1 tablet (20 mg total) by mouth 2 (two) times daily., Disp: 40 tablet, Rfl: 0   glucose blood (ACCU-CHEK GUIDE) test strip, Twice daily, Disp: 100 each, Rfl: 12   hydrOXYzine (ATARAX) 10 MG tablet, Take 1 tablet (10 mg total) by mouth 3 (three) times daily as needed for itching., Disp: 30 tablet, Rfl: 0   metFORMIN (GLUCOPHAGE) 500 MG tablet, Take 1 tablet (500 mg total) by mouth 2 (two) times daily with a meal., Disp: 180 tablet, Rfl: 3   predniSONE (DELTASONE) 20 MG tablet,  Take 1 tablet (20 mg total) by mouth daily with breakfast., Disp: 5 tablet, Rfl: 0   Allergies  Allergen Reactions   Other Other (See Comments)    Anesthetic "pentathol" caused hallucinations over 48 hrs   Latex     UNSPECIFIED REACTION    Mango Flavor [Flavoring Agent] Rash and Other (See Comments)    Mango skin per pt- he has no allergy to mango flavoring     Past Medical History:  Diagnosis Date   Back pain    chronic   Diabetes mellitus without complication (HCC)    GERD (gastroesophageal reflux disease)    Hyperglycemia 08/20/2020   Obesity    OSA (obstructive sleep apnea)    Sleep apnea    wears cpap      Past Surgical History:  Procedure Laterality Date   COLONOSCOPY     HEMANGIOMA EXCISION  1990   left leg   HERNIA REPAIR     KNEE SURGERY Right 2015   ORIF RADIAL FRACTURE Left 07/20/2018   Procedure: LEFT ELBOW OPEN REDUCTION INTERNAL FIXATION (ORIF) AND REPAIR AS INDICATED, LIGAMENT AND BONE;  Surgeon: Bradly Bienenstock, MD;  Location: [facility];  Service: Orthopedics;  Laterality: Left;   POLYPECTOMY     TA -x 1    TOE SURGERY     remove ingrown toenail     Family History  Problem Relation Age of Onset   Arthritis Mother  spine   Heart disease Mother    Hypertension Mother    Cancer Mother        bone   Heart disease Brother        murmur   Colon polyps Father    Colon cancer Neg Hx    Esophageal cancer Neg Hx    Rectal cancer Neg Hx    Stomach cancer Neg Hx     Social History   Tobacco Use   Smoking status: Never   Smokeless tobacco: Never  Substance Use Topics   Alcohol use: Not Currently   Drug use: No    ROS   Objective:   Vitals: BP (!) 143/81 (BP Location: Right Arm)   Pulse 77   Temp 98 F (36.7 C) (Oral)   Resp 20   SpO2 95%   Physical Exam Constitutional:      General: He is not in acute distress.    Appearance: Normal appearance. He is well-developed and normal weight. He is not ill-appearing, toxic-appearing or  diaphoretic.  HENT:     Head: Normocephalic and atraumatic.     Right Ear: External ear normal.     Left Ear: External ear normal.     Nose: Nose normal.     Mouth/Throat:     Pharynx: Oropharynx is clear.  Eyes:     General: No scleral icterus.       Right eye: No discharge.        Left eye: No discharge.     Extraocular Movements: Extraocular movements intact.  Cardiovascular:     Rate and Rhythm: Normal rate.  Pulmonary:     Effort: Pulmonary effort is normal.  Musculoskeletal:     Cervical back: Normal range of motion.     Left upper leg: Swelling (trace across area outlined) and tenderness (across area outlined) present. No edema, deformity, lacerations or bony tenderness.     Left knee: No swelling, deformity, effusion, erythema, ecchymosis, lacerations, bony tenderness or crepitus. Normal range of motion. No tenderness. Normal alignment and normal patellar mobility.       Legs:  Neurological:     Mental Status: He is alert and oriented to person, place, and time.  Psychiatric:        Mood and Affect: Mood normal.        Behavior: Behavior normal.        Thought Content: Thought content normal.        Judgment: Judgment normal.      Assessment and Plan :   PDMP not reviewed this encounter.  1. Acute pain of left thigh   2. Quadriceps strain, left, initial encounter    Patient declined repeat x-rays.  I am in agreement.  Recommended that he follow-up with his orthopedic group for consideration of an MRI which the patient states was going to be considered.  I will refill his methocarbamol.  Continue using meloxicam as needed.  Counseled patient on potential for adverse effects with medications prescribed/recommended today, ER and return-to-clinic precautions discussed, patient verbalized understanding.    Wallis Bamberg, PA-C 08/30/23 1426

## 2023-08-30 NOTE — ED Triage Notes (Signed)
Pt c/o LLE pain x 5.5 weeks-started after moving furniture-states he was seen in [place] with neg xrays-was dx with muscles strains-pain was better but worse after recent travel to [place]-NAD-slow gait

## 2024-09-09 ENCOUNTER — Ambulatory Visit: Payer: Self-pay

## 2024-09-09 NOTE — Telephone Encounter (Signed)
 FYI Only or Action Required?: FYI only for provider: appointment scheduled on 09/12/24.  Patient was last seen in primary care on 10/01/2021 by Jason Leita Repine, FNP.  Called Nurse Triage reporting Rash.  Symptoms began about a month ago.  Interventions attempted: Other: Nothing for rash, using prescribed wipes OcuSoft for eye puffiness with little to no improvement.  Symptoms are: stable.  Triage Disposition: See PCP When Office is Open (Within 3 Days)  Patient/caregiver understands and will follow disposition?: Yes    Copied from CRM [id number]. Topic: Clinical - Red Word Triage >> Sep 09, 2024  7:44 AM [facility] OR[facility] wrote: Red Word that prompted transfer to Nurse Triage: Rash >> Sep 09, 2024  7:46 AM [facility] OR[facility] wrote: Patient states he has a rash on his rear end, his left shoulder is causing him problems, as well as his eyes are puffy.   Reason for Disposition  Localized rash present > 7 days  Answer Assessment - Initial Assessment Questions 1. APPEARANCE of RASH: What does the rash look like? (e.g., blisters, dry flaky skin, red spots, redness, sores)     Pt states feels dry and flaky 2. LOCATION: Where is the rash located?      Bilateral Buttocks  4. SIZE: How big are the spots? (e.g., inches, cm; or compare to size of pinhead, tip of pen, eraser, pea)      Unable to sese 5. ONSET: When did the rash start?      X 30 days 6. ITCHING: Does the rash itch? If Yes, ask: How bad is the itch?  (Scale 0-10; or none, mild, moderate, severe)     Itchy but not bad 7. PAIN: Does the rash hurt? If Yes, ask: How bad is the pain?  (Scale 0-10; or none, mild, moderate, severe)     None 8. OTHER SYMPTOMS: Do you have any other symptoms? (e.g., fever)     Puffy eyes, ongoing as well  Protocols used: Rash or Redness - Localized-A-AH

## 2024-09-11 NOTE — Progress Notes (Signed)
 Acute Office Visit  Subjective:  Patient ID: [name], male    DOB: [birth year]  Age: 73 y.o. MRN: [id number]  CC:  Chief Complaint  Patient presents with   Eye Problem      HPI [name] is here for Rash, Eye Swelling.   Discussed the use of AI scribe software for clinical note transcription with the patient, who gave verbal consent to proceed.  History of Present Illness [name] is a 73 year old male who presents with persistent redness and puffiness of the eyelids.  He has experienced persistent redness on his eyelids for approximately one year, with episodes of increased puffiness that vary without identifiable triggers. There is no associated itching, pain, or drainage, and his vision remains unaffected. He has been using cleansing wipes provided by his eye doctor, which do not cause any burning or stinging sensations. He reports that he did not notice any triggers or changes with weather.  There is no family history of similar conditions, and he denies symptoms suggestive of allergies, such as sneezing or a runny nose. He also reports no history of skin conditions like eczema or psoriasis.  He reports a small lesion/scab on the top of his head that he keeps scraping off, but it returns. He also mentions an intermittent itchy/dry rash to his buttocks that does not affect his rectum/anus.   He lives alone and recently moved to [place] for retirement, although he continues to work. He has been experiencing fatigue for the past seven weeks, which he attributes to quitting Diet Coke cold turkey after consuming more than two liters daily. This change was prompted by dental surgery that required him to avoid carbonation and caffeine. His current medication regimen includes only a baby aspirin. He has not had any blood work done since 2022, and he last saw his primary care provider in 2022 before moving out of state.       Past Medical History:   Diagnosis Date   Back pain    chronic   Diabetes mellitus without complication (HCC)    GERD (gastroesophageal reflux disease)    Hyperglycemia 08/20/2020   Obesity    OSA (obstructive sleep apnea)    Sleep apnea    wears cpap     Past Surgical History:  Procedure Laterality Date   COLONOSCOPY     HEMANGIOMA EXCISION  1990   left leg   HERNIA REPAIR     KNEE SURGERY Right 2015   ORIF RADIAL FRACTURE Left 07/20/2018   Procedure: LEFT ELBOW OPEN REDUCTION INTERNAL FIXATION (ORIF) AND REPAIR AS INDICATED, LIGAMENT AND BONE;  Surgeon: Shari Easter, MD;  Location: [facility] OR;  Service: Orthopedics;  Laterality: Left;   POLYPECTOMY     TA -x 1    TOE SURGERY     remove ingrown toenail     Family History  Problem Relation Age of Onset   Arthritis Mother        spine   Heart disease Mother    Hypertension Mother    Cancer Mother        bone   Heart disease Brother        murmur   Colon polyps Father    Colon cancer Neg Hx    Esophageal cancer Neg Hx    Rectal cancer Neg Hx    Stomach cancer Neg Hx     Social History   Socioeconomic History   Marital status: Single  Spouse name: Not on file   Number of children: Not on file   Years of education: Not on file   Highest education level: Not on file  Occupational History   Not on file  Tobacco Use   Smoking status: Never   Smokeless tobacco: Never  Substance and Sexual Activity   Alcohol use: Not Currently   Drug use: No   Sexual activity: Not on file  Other Topics Concern   Not on file  Social History Narrative   Not on file   Social Drivers of Health   Financial Resource Strain: Not on file  Food Insecurity: Not on file  Transportation Needs: Not on file  Physical Activity: Not on file  Stress: Not on file  Social Connections: Not on file  Intimate Partner Violence: Not on file    ROS All ROS negative except what is listed in the HPI.   Objective:   Today's Vitals: BP (!) 93/48   Pulse 86   Ht  5' 10 (1.778 m)   Wt 263 lb (119.3 kg)   SpO2 96%   BMI 37.74 kg/m   Physical Exam Vitals reviewed.  Constitutional:      Appearance: Normal appearance. He is obese.  Eyes:     General:        Right eye: No discharge.        Left eye: No discharge.     Extraocular Movements: Extraocular movements intact.     Conjunctiva/sclera: Conjunctivae normal.     Comments: Upper and lower lash line with mild hyperpigmentation without spreading erythema, inflammation, lesions, flaking skin; slightly puffy under both eyes  Cardiovascular:     Rate and Rhythm: Normal rate and regular rhythm.  Pulmonary:     Effort: Pulmonary effort is normal.     Breath sounds: Normal breath sounds.  Skin:    Comments: Bilateral buttocks with dry skin and mild excoriation with scabbing; scalp with some sun spots and one lesion that he has picked out <78mm, no drainage or erythema   Neurological:     Mental Status: He is alert and oriented to person, place, and time.  Psychiatric:        Mood and Affect: Mood normal.        Behavior: Behavior normal.        Thought Content: Thought content normal.        Judgment: Judgment normal.        Assessment & Plan:   Problem List Items Addressed This Visit   None Visit Diagnoses       Rash    -  Primary Chronic eyelid redness and puffiness for one year without typical blepharitis symptoms. No infection or allergy signs.  - Consult with eye doctor (appointment today) for further evaluation. - Try cool compresses for symptomatic relief. - Ordered blood work to check thyroid function and update labs. For buttocks irritation/dry skin recommend barrier cream. Can try hydrocortisone cream if itchy. No signs of infection or allergy.  For scalp lesion, neosporin if open lesion. Try not to pick at it. Follow-up with dermatology to further evaluate recurrent lesion.     Relevant Orders   CBC with Differential/Platelet   Comprehensive metabolic panel with GFR    TSH          Follow-up: Return if symptoms worsen or fail to improve.   Waddell FURY Almarie, DNP, FNP-C  I,Emily Lagle,acting as a neurosurgeon for Waddell KATHEE Almarie, NP.,have documented all relevant documentation  on the behalf of Waddell KATHEE Mon, NP.   I, Waddell KATHEE Mon, NP, have reviewed all documentation for this visit. The documentation on 09/12/2024 for the exam, diagnosis, procedures, and orders are all accurate and complete.

## 2024-09-12 ENCOUNTER — Encounter: Payer: Self-pay | Admitting: Family Medicine

## 2024-09-12 ENCOUNTER — Ambulatory Visit (INDEPENDENT_AMBULATORY_CARE_PROVIDER_SITE_OTHER): Admitting: Family Medicine

## 2024-09-12 VITALS — BP 93/48 | HR 86 | Ht 70.0 in | Wt 263.0 lb

## 2024-09-12 DIAGNOSIS — R21 Rash and other nonspecific skin eruption: Secondary | ICD-10-CM | POA: Diagnosis not present

## 2024-09-12 LAB — COMPREHENSIVE METABOLIC PANEL WITH GFR
ALT: 12 U/L (ref 0–53)
AST: 15 U/L (ref 0–37)
Albumin: 4.5 g/dL (ref 3.5–5.2)
Alkaline Phosphatase: 65 U/L (ref 39–117)
BUN: 22 mg/dL (ref 6–23)
CO2: 26 meq/L (ref 19–32)
Calcium: 9.1 mg/dL (ref 8.4–10.5)
Chloride: 101 meq/L (ref 96–112)
Creatinine, Ser: 0.95 mg/dL (ref 0.40–1.50)
GFR: 80.24 mL/min (ref >60.00)
Glucose, Bld: 202 mg/dL — ABNORMAL HIGH (ref 70–99)
Potassium: 4.2 meq/L (ref 3.5–5.1)
Sodium: 137 meq/L (ref 135–145)
Total Bilirubin: 0.9 mg/dL (ref 0.2–1.2)
Total Protein: 7.3 g/dL (ref 6.0–8.3)

## 2024-09-12 LAB — CBC WITH DIFFERENTIAL/PLATELET
Basophils Absolute: 0.1 K/uL (ref 0.0–0.1)
Basophils Relative: 0.8 % (ref 0.0–3.0)
Eosinophils Absolute: 0.2 K/uL (ref 0.0–0.7)
Eosinophils Relative: 2.8 % (ref 0.0–5.0)
HCT: 45.2 % (ref 39.0–52.0)
Hemoglobin: 15.5 g/dL (ref 13.0–17.0)
Lymphocytes Relative: 29.3 % (ref 12.0–46.0)
Lymphs Abs: 2.1 K/uL (ref 0.7–4.0)
MCHC: 34.2 g/dL (ref 30.0–36.0)
MCV: 84.1 fl (ref 78.0–100.0)
Monocytes Absolute: 0.4 K/uL (ref 0.1–1.0)
Monocytes Relative: 6 % (ref 3.0–12.0)
Neutro Abs: 4.4 K/uL (ref 1.4–7.7)
Neutrophils Relative %: 61.1 % (ref 43.0–77.0)
Platelets: 124 K/uL — ABNORMAL LOW (ref 150.0–400.0)
RBC: 5.37 Mil/uL (ref 4.22–5.81)
RDW: 14.3 % (ref 11.5–15.5)
WBC: 7.2 K/uL (ref 4.0–10.5)

## 2024-09-12 LAB — TSH: TSH: 1.54 u[IU]/mL (ref 0.35–5.50)

## 2024-09-13 ENCOUNTER — Ambulatory Visit: Payer: Self-pay | Admitting: Family Medicine

## 2024-09-13 DIAGNOSIS — R739 Hyperglycemia, unspecified: Secondary | ICD-10-CM

## 2024-09-16 ENCOUNTER — Ambulatory Visit

## 2024-09-16 ENCOUNTER — Ambulatory Visit: Payer: Self-pay | Admitting: Family Medicine

## 2024-09-16 DIAGNOSIS — E1165 Type 2 diabetes mellitus with hyperglycemia: Secondary | ICD-10-CM

## 2024-09-16 DIAGNOSIS — R739 Hyperglycemia, unspecified: Secondary | ICD-10-CM

## 2024-09-16 DIAGNOSIS — M25519 Pain in unspecified shoulder: Secondary | ICD-10-CM

## 2024-09-16 LAB — HEMOGLOBIN A1C: Hgb A1c MFr Bld: 7 % — ABNORMAL HIGH (ref 4.6–6.5)

## 2024-09-17 MED ORDER — FREESTYLE LIBRE 3 PLUS SENSOR MISC: 3 refills | Status: AC

## 2024-09-17 NOTE — Telephone Encounter (Signed)
 Please advise if this would come from you or Melissa, you saw for urgent visit and Melissa is PCP. (Copying her on this message)

## 2024-09-19 NOTE — Addendum Note (Signed)
 Addended by: DARYL SETTER on: 09/19/2024 09:09 AM   Modules accepted: Orders

## 2024-09-19 NOTE — Telephone Encounter (Signed)
 See mychart.

## 2024-09-20 ENCOUNTER — Other Ambulatory Visit (HOSPITAL_COMMUNITY): Payer: Self-pay

## 2024-09-20 ENCOUNTER — Telehealth: Payer: Self-pay

## 2024-09-20 DIAGNOSIS — E1165 Type 2 diabetes mellitus with hyperglycemia: Secondary | ICD-10-CM

## 2024-09-20 NOTE — Telephone Encounter (Signed)
 Pharmacy Patient Advocate Encounter   Received notification from Patient Advice Request messages that prior authorization for Freestyle Libre 3 Plus sensors is required/requested.   Insurance verification completed.   The patient is insured through [facility].   Per test claim: PA required; PA started via CoverMyMeds. KEY [id number] . Please see clinical question(s) below that I am not finding the answer to in their chart and advise.

## 2024-09-23 NOTE — Telephone Encounter (Signed)
 Copied from CRM [id number]. Topic: Clinical - Medication Prior Auth >> Sep 23, 2024  1:13 PM Mia F wrote: Reason for CRM: Keven from [facility] calling stating that the PA was denied for the [employer]. He says pt recieved a message stating the PA was denied but on their end it is stating it's still pending. He says he to contact the Auth number [id number]. Or call pt to clarify

## 2024-09-24 NOTE — Telephone Encounter (Signed)
 Please send back info that he is not on insulin to insurance. tks

## 2024-09-25 MED ORDER — BLOOD GLUCOSE TEST VI STRP: 1.0000 | ORAL_STRIP | 0 refills | Status: AC

## 2024-09-25 MED ORDER — BLOOD GLUCOSE MONITORING SUPPL DEVI: 1.0000 | 0 refills | Status: AC

## 2024-09-25 MED ORDER — LANCETS MISC: 1.0000 | 0 refills | Status: AC

## 2024-09-25 MED ORDER — LANCET DEVICE MISC: 1.0000 | 0 refills | Status: AC

## 2024-09-25 NOTE — Telephone Encounter (Signed)
 I don't believe he has a meter or he has had any episodes of hypoglycemia. Thank you.

## 2024-09-25 NOTE — Addendum Note (Signed)
 Addended by: DARYL SETTER on: 09/25/2024 05:31 PM   Modules accepted: Orders

## 2024-10-10 NOTE — Progress Notes (Deleted)
° °  Subjective:    Patient ID: [name], male    DOB: 73 y.o., [birth year]   MRN: [id number]  Chief Complaint: ***  Discussed the use of AI scribe software for clinical note transcription with the patient, who gave verbal consent to proceed.  History of Present Illness   Review of pertinent imaging: Patient also providing a left elbow MRI obtained on 07/06/2018 Impression: Findings consistent with subacute posterior elbow dislocation. Patient mildly comminuted intra-articular fracture along the anterior to central aspect of the radial head and neck, with 2 mm of articular surface depression.  Mild osseous contusion along the posterior capitellar. High-grade tearing of the common extensor tendon origin, with extension into the L UCL, and proximal fibers of the RCL. Mild partial tearing of the UCL, with mild common flexor strain. Mild to moderate supinator and mild triceps muscle strains.  Intact distal triceps tendon. Moderate to large effusion with pericapsular edema.     Objective:   There were no vitals filed for this visit.  ***     Assessment & Plan:   Assessment & Plan

## 2024-10-11 ENCOUNTER — Ambulatory Visit

## 2024-10-14 ENCOUNTER — Ambulatory Visit
